# Patient Record
Sex: Male | Born: 1950 | Race: White | Hispanic: No | Marital: Married | Smoking: Never smoker
Health system: Southern US, Community
[De-identification: ages and names within clinical notes are randomized; demographics above are authoritative.]

## PROBLEM LIST (undated history)

## (undated) DIAGNOSIS — I10 Essential (primary) hypertension: Secondary | ICD-10-CM

## (undated) DIAGNOSIS — M545 Low back pain, unspecified: Secondary | ICD-10-CM

## (undated) DIAGNOSIS — I341 Nonrheumatic mitral (valve) prolapse: Secondary | ICD-10-CM

## (undated) DIAGNOSIS — I82409 Acute embolism and thrombosis of unspecified deep veins of unspecified lower extremity: Secondary | ICD-10-CM

## (undated) DIAGNOSIS — C801 Malignant (primary) neoplasm, unspecified: Secondary | ICD-10-CM

## (undated) DIAGNOSIS — M1990 Unspecified osteoarthritis, unspecified site: Secondary | ICD-10-CM

## (undated) DIAGNOSIS — G8929 Other chronic pain: Secondary | ICD-10-CM

## (undated) DIAGNOSIS — Z9229 Personal history of other drug therapy: Secondary | ICD-10-CM

## (undated) DIAGNOSIS — E7800 Pure hypercholesterolemia, unspecified: Secondary | ICD-10-CM

## (undated) DIAGNOSIS — H9319 Tinnitus, unspecified ear: Secondary | ICD-10-CM

## (undated) DIAGNOSIS — I2699 Other pulmonary embolism without acute cor pulmonale: Secondary | ICD-10-CM

## (undated) DIAGNOSIS — K219 Gastro-esophageal reflux disease without esophagitis: Secondary | ICD-10-CM

## (undated) DIAGNOSIS — I493 Ventricular premature depolarization: Secondary | ICD-10-CM

## (undated) HISTORY — DX: Personal history of other drug therapy: Z92.29

## (undated) HISTORY — DX: Low back pain: M54.5

## (undated) HISTORY — DX: Pure hypercholesterolemia, unspecified: E78.00

## (undated) HISTORY — DX: Other chronic pain: G89.29

## (undated) HISTORY — DX: Low back pain, unspecified: M54.50

## (undated) HISTORY — DX: Gastro-esophageal reflux disease without esophagitis: K21.9

## (undated) HISTORY — DX: Nonrheumatic mitral (valve) prolapse: I34.1

## (undated) HISTORY — DX: Other pulmonary embolism without acute cor pulmonale: I26.99

## (undated) HISTORY — DX: Tinnitus, unspecified ear: H93.19

## (undated) HISTORY — PX: TONSILLECTOMY: SUR1361

## (undated) HISTORY — DX: Acute embolism and thrombosis of unspecified deep veins of unspecified lower extremity: I82.409

## (undated) HISTORY — DX: Ventricular premature depolarization: I49.3

---

## 1977-06-08 DIAGNOSIS — J189 Pneumonia, unspecified organism: Secondary | ICD-10-CM

## 1977-06-08 HISTORY — DX: Pneumonia, unspecified organism: J18.9

## 2003-06-09 DIAGNOSIS — I82409 Acute embolism and thrombosis of unspecified deep veins of unspecified lower extremity: Secondary | ICD-10-CM

## 2003-06-09 HISTORY — DX: Acute embolism and thrombosis of unspecified deep veins of unspecified lower extremity: I82.409

## 2004-06-08 HISTORY — PX: LUMBAR SPINE SURGERY: SHX701

## 2005-06-08 HISTORY — PX: COLONOSCOPY: SHX174

## 2015-06-09 HISTORY — PX: TRANSTHORACIC ECHOCARDIOGRAM: SHX275

## 2016-03-31 ENCOUNTER — Ambulatory Visit (INDEPENDENT_AMBULATORY_CARE_PROVIDER_SITE_OTHER): Payer: 59 | Admitting: Cardiology

## 2016-03-31 ENCOUNTER — Encounter: Payer: Self-pay | Admitting: Cardiology

## 2016-03-31 VITALS — BP 138/88 | HR 100 | Ht 70.0 in | Wt 208.5 lb

## 2016-03-31 DIAGNOSIS — I341 Nonrheumatic mitral (valve) prolapse: Secondary | ICD-10-CM | POA: Diagnosis not present

## 2016-03-31 DIAGNOSIS — R002 Palpitations: Secondary | ICD-10-CM | POA: Diagnosis not present

## 2016-03-31 NOTE — Patient Instructions (Addendum)
Testing/Procedures: Your physician has requested that you have an echocardiogram. Echocardiography is a painless test that uses sound waves to create images of your heart. It provides your doctor with information about the size and shape of your heart and how well your heart's chambers and valves are working. This procedure takes approximately one hour. There are no restrictions for this procedure.    Follow-Up: Your physician wants you to follow-up in: 1 year with Dr. Ingal. You will receive a reminder letter in the mail two months in advance. If you don't receive a letter, please call our office to schedule the follow-up appointment.  It was a pleasure seeing you today here in the office. Please do not hesitate to give us a call back if you have any further questions. [phone]  Jenie Parish A. RN, BSN      Echocardiogram An echocardiogram, or echocardiography, uses sound waves (ultrasound) to produce an image of your heart. The echocardiogram is simple, painless, obtained within a short period of time, and offers valuable information to your health care provider. The images from an echocardiogram can provide information such as:  Evidence of coronary artery disease (CAD).  Heart size.  Heart muscle function.  Heart valve function.  Aneurysm detection.  Evidence of a past heart attack.  Fluid buildup around the heart.  Heart muscle thickening.  Assess heart valve function. LET YOUR HEALTH CARE PROVIDER KNOW ABOUT:  Any allergies you have.  All medicines you are taking, including vitamins, herbs, eye drops, creams, and over-the-counter medicines.  Previous problems you or members of your family have had with the use of anesthetics.  Any blood disorders you have.  Previous surgeries you have had.  Medical conditions you have.  Possibility of pregnancy, if this applies. BEFORE THE PROCEDURE  No special preparation is needed. Eat and drink normally.  PROCEDURE   In  order to produce an image of your heart, gel will be applied to your chest and a wand-like tool (transducer) will be moved over your chest. The gel will help transmit the sound waves from the transducer. The sound waves will harmlessly bounce off your heart to allow the heart images to be captured in real-time motion. These images will then be recorded.  You may need an IV to receive a medicine that improves the quality of the pictures. AFTER THE PROCEDURE You may return to your normal schedule including diet, activities, and medicines, unless your health care provider tells you otherwise.   This information is not intended to replace advice given to you by your health care provider. Make sure you discuss any questions you have with your health care provider.   Document Released: 05/22/2000 Document Revised: 06/15/2014 Document Reviewed: 01/30/2013 Elsevier Interactive Patient Education 2016 Elsevier Inc.  

## 2016-03-31 NOTE — Progress Notes (Signed)
Cardiology Office Note   Date:  03/31/2016   ID:  Albert [name], Albert [name] March 01, 1951, MRN [id number]  Referring Doctor:  No primary care provider on file.   Cardiologist:   Wende Bushy, MD   Reason for consultation:  Chief Complaint  Patient presents with  . other    Est. Care no complaints. Meds reviewed verbally with pt.  History of mitral valve prolapse    History of Present Illness: Albert [name] is a 65 y.o. male who presents for establishing cardiology care for history of mitral valve prolapse. He was told in the past to see a cardiologist at least every 5 years for an echocardiogram. On his last echocardiogram in [place], he was told that his mitral valve actually did not look too bad and the prolapse was really borderline.  Patient denies symptoms of chest pain and shortness of breath. He recently started working out again and walks/jogs on the treadmill 30 minutes, 4 times a week. This pedis 2-3 mph. At the end of the exercise, he is completely asymptomatic.  He has occasional palpitations which were known from previous monitors to be PVCs. He is aware of certain triggers: Stress or anxiety, bloatedness or possibly reflux or heartburn.  Patient denies PND, orthopnea, edema. No abdominal pain, loss of consciousness.   ROS:  Please see the history of present illness. Aside from mentioned under HPI, all other systems are reviewed and negative.     Past Medical History:  Diagnosis Date  . Acute deep vein thrombosis of lower limb (HCC)   . Chronic lower back pain   . GERD (gastroesophageal reflux disease)   . History of anticoagulant therapy   . Hypercholesterolemia   . Mitral valve prolapse   . Pulmonary embolism ([place])   . Tinnitus     Past Surgical History:  Procedure Laterality Date  . COLONOSCOPY  2007  . LUMBAR SPINE SURGERY  2006  . TONSILLECTOMY       reports that he has never smoked. He has never used smokeless tobacco. He reports that he does not  drink alcohol or use drugs.   family history includes Lung cancer in his father and mother.   No outpatient prescriptions prior to visit.   No facility-administered medications prior to visit.      Allergies: Aspirin; Nsaids; and Penicillins    PHYSICAL EXAM: VS:  BP 138/88 (BP Location: Right Arm, Patient Position: Sitting, Cuff Size: Normal)   Pulse 100   Ht 5\' 10"  (1.778 m)   Wt 208 lb 8 oz (94.6 kg)   BMI 29.92 kg/m  , Body mass index is 29.92 kg/m. Wt Readings from Last 3 Encounters:  03/31/16 208 lb 8 oz (94.6 kg)    GENERAL:  well developed, well nourished, Overweight, not in acute distress HEENT: normocephalic, pink conjunctivae, anicteric sclerae, no xanthelasma, normal dentition, oropharynx clear NECK:  no neck vein engorgement, JVP normal, no hepatojugular reflux, carotid upstroke brisk and symmetric, no bruit, no thyromegaly, no lymphadenopathy LUNGS:  good respiratory effort, clear to auscultation bilaterally CV:  PMI not displaced, no thrills, no lifts, S1 and S2 within normal limits, no palpable S3 or S4, no murmurs, no rubs, no gallops ABD:  Soft, nontender, nondistended, normoactive bowel sounds, no abdominal aortic bruit, no hepatomegaly, no splenomegaly MS: nontender back, no kyphosis, no scoliosis, no joint deformities EXT:  2+ DP/PT pulses, no edema, no varicosities, no cyanosis, no clubbing SKIN: warm, nondiaphoretic, normal turgor, no ulcers NEUROPSYCH: alert,  oriented to person, place, and time, sensory/motor grossly intact, normal mood, appropriate affect  Recent Labs: No results found for requested labs within last 8760 hours.   Lipid Panel No results found for: CHOL, TRIG, HDL, CHOLHDL, VLDL, LDLCALC, LDLDIRECT   Other studies Reviewed:  EKG:  The ekg from 03/31/2016 was personally reviewed by me and it revealed sinus rhythm, 100 BPM.  Additional studies/ records that were reviewed personally reviewed by me today include: None available  Lipid  panel 02/22/2016: TC 216 Triglyceride 180 HDL 49 LDL 131  ASSESSMENT AND PLAN:  History of mitral valve prolapse  No appreciable murmur. recommend echocardiogram for surveillance  Palpitations Per patient, he was told that he had PVCs. That was the reason he was started on metoprolol. So far, he has not needed a higher dose. He can deal with the triggers such as stress and bloatedness. We talked about why stress can lead to more PVCs. We also talked about how bloatedness or heartburn or reflux or any GI issue may possibly lead to ectopy or PVCs.  He should inform office if he has worsening of his palpitations. At that time, we can consider another monitor. Patient verbalized understanding.  Hyperlipidemia Patient has elected to do lifestyle changes including heart healthy diet and increased physical activity. We talked about heart healthy diet.  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  The patient does not have concerns regarding medicines.  Labs/ tests ordered today include:  Orders Placed This Encounter  Procedures  . EKG 12-Lead    I had a lengthy and detailed discussion with the patient regarding diagnoses, prognosis, diagnostic options, treatment options , and side effects of medications.   I counseled the patient on importance of lifestyle modification including heart healthy diet, regular physical activity .   Disposition:   FU with undersigned In one year  I spent at least 40 minutes with the patient today and more than 50% of the time was spent counseling the patient and coordinating care.    Signed, Wende Bushy, MD  03/31/2016 11:32 AM    [place]  This note was generated in part with voice recognition software and I apologize for any typographical errors that were not detected and corrected.

## 2016-05-04 ENCOUNTER — Ambulatory Visit (INDEPENDENT_AMBULATORY_CARE_PROVIDER_SITE_OTHER): Payer: 59

## 2016-05-04 ENCOUNTER — Other Ambulatory Visit: Payer: Self-pay

## 2016-05-04 DIAGNOSIS — I341 Nonrheumatic mitral (valve) prolapse: Secondary | ICD-10-CM | POA: Diagnosis not present

## 2018-10-04 ENCOUNTER — Telehealth: Payer: Self-pay | Admitting: Cardiology

## 2018-10-04 NOTE — Telephone Encounter (Signed)
Spoke with patient to schedule follow up Patient has moved to [place], [place] Removed recall

## 2019-06-19 DIAGNOSIS — I493 Ventricular premature depolarization: Secondary | ICD-10-CM | POA: Diagnosis not present

## 2019-06-19 DIAGNOSIS — G4700 Insomnia, unspecified: Secondary | ICD-10-CM | POA: Diagnosis not present

## 2019-06-19 DIAGNOSIS — I341 Nonrheumatic mitral (valve) prolapse: Secondary | ICD-10-CM | POA: Diagnosis not present

## 2019-06-19 DIAGNOSIS — Z86711 Personal history of pulmonary embolism: Secondary | ICD-10-CM | POA: Diagnosis not present

## 2019-06-22 ENCOUNTER — Telehealth: Payer: Self-pay | Admitting: Hematology and Oncology

## 2019-06-22 NOTE — Telephone Encounter (Signed)
Received a new hem referral from Dr. Moreen Fowler for discuss option of switching from coumadin to [place]. Albert [name] has been cld and scheduled to see Dr. Lindi Adie on 1/20 at 345pm. Pt aware to arrive 15 minutes early.

## 2019-06-27 NOTE — Progress Notes (Signed)
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 male is here because of a history of DVT and PE. He is referred by his PCP, Dr. Antony Contras. After lower back surgery in 2005 he developed a left knee DVT and PE. Work-up was negative at that time. In 2007, he developed a right DVT and was started on warfarin 5mg  daily. He developed a third DVT in 2016 after stopping warfarin and was reinitiated on lifelong anticoagulation. He presents to the clinic today for initial evaluation and discussion of switching from warfarin to DOAC.   I reviewed his records extensively and collaborated the history with the patient.  MEDICAL HISTORY:  Past Medical History:  Diagnosis Date  . Acute deep vein thrombosis of lower limb (HCC)   . Chronic lower back pain   . GERD (gastroesophageal reflux disease)   . History of anticoagulant therapy   . Hypercholesterolemia   . Mitral valve prolapse   . Pulmonary embolism ([facility])   . Tinnitus     SURGICAL HISTORY: Past Surgical History:  Procedure Laterality Date  . COLONOSCOPY  2007  . LUMBAR SPINE SURGERY  2006  . TONSILLECTOMY      SOCIAL HISTORY: Social History   Socioeconomic History  . Marital status: Married    Spouse name: Not on file  . Number of children: Not on file  . Years of education: Not on file  . Highest education level: Not on file  Occupational History  . Not on file  Tobacco Use  . Smoking status: Never Smoker  . Smokeless tobacco: Never Used  Substance and Sexual Activity  . Alcohol use: No  . Drug use: No  . Sexual activity: Not on file  Other Topics Concern  . Not on file  Social History Narrative  . Not on file   Social Determinants of Health   Financial Resource Strain:   . Difficulty of Paying  Living Expenses: Not on file  Food Insecurity:   . Worried About Charity fundraiser in the Last Year: Not on file  . Ran Out of Food in the Last Year: Not on file  Transportation Needs:   . Lack of Transportation (Medical): Not on file  . Lack of Transportation (Non-Medical): Not on file  Physical Activity:   . Days of Exercise per Week: Not on file  . Minutes of Exercise per Session: Not on file  Stress:   . Feeling of Stress : Not on file  Social Connections:   . Frequency of Communication with Friends and Family: Not on file  . Frequency of Social Gatherings with Friends and Family: Not on file  . Attends Religious Services: Not on file  . Active Member of Clubs or Organizations: Not on file  . Attends Archivist Meetings: Not on file  . Marital Status: Not on file  Intimate Partner Violence:   . Fear of Current or Ex-Partner: Not on file  . Emotionally Abused: Not on file  . Physically Abused: Not on file  . Sexually Abused: Not on file    FAMILY HISTORY: Family History  Problem Relation Age of Onset  . Lung cancer Mother   . Lung cancer Father     ALLERGIES:  is allergic to aspirin; nsaids; and penicillins.  MEDICATIONS:  Current Outpatient Medications  Medication Sig Dispense Refill  . metoprolol succinate (TOPROL-XL) 25 MG 24 hr tablet Takes 1 tablet daily.  1  . traMADol (ULTRAM) 50 MG tablet TK 1 T PO BID PRN  1  . warfarin (COUMADIN) 5 MG tablet TK 1 T PO TWICE A WEEK AND 1 AND 1/2 TS THE REST OF THE WEEK  3   No current facility-administered medications for this visit.    REVIEW OF SYSTEMS:   Constitutional: Denies fevers, chills or abnormal night sweats Eyes: Denies blurriness of vision, double vision or watery eyes Ears, nose, mouth, throat, and face: Denies mucositis or sore throat Respiratory: Denies cough, dyspnea or wheezes Cardiovascular: Denies palpitation, chest discomfort or lower extremity swelling Gastrointestinal:  Denies nausea,  heartburn or change in bowel habits Skin: Denies abnormal skin rashes Lymphatics: Denies new lymphadenopathy or easy bruising Neurological:Denies numbness, tingling or new weaknesses Behavioral/Psych: Mood is stable, no new changes  All other systems were reviewed with the patient and are negative.  PHYSICAL EXAMINATION: ECOG PERFORMANCE STATUS: 1 - Symptomatic but completely ambulatory  There were no vitals filed for this visit. There were no vitals filed for this visit.  GENERAL:alert, no distress and comfortable SKIN: skin color, texture, turgor are normal, no rashes or significant lesions EYES: normal, conjunctiva are pink and non-injected, sclera clear OROPHARYNX:no exudate, no erythema and lips, buccal mucosa, and tongue normal  NECK: supple, thyroid normal size, non-tender, without nodularity LYMPH:  no palpable lymphadenopathy in the cervical, axillary or inguinal LUNGS: clear to auscultation and percussion with normal breathing effort HEART: regular rate & rhythm and no murmurs and no lower extremity edema ABDOMEN:abdomen soft, non-tender and normal bowel sounds Musculoskeletal:no cyanosis of digits and no clubbing  PSYCH: alert & oriented x 3 with fluent speech NEURO: no focal motor/sensory deficits  ASSESSMENT AND PLAN:  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 He does not smoke cigarettes and stays very active and he is not overweight.  There is no family history of blood clots either. Patient has retired from [employer].  Patient moved from [place] to [place] 05/22/2019.  This is to be closer to their grandchildren. Current treatment: Coumadin   Regarding the question about switching from Coumadin to DOAC's: I would like to check for lupus anticoagulant testing.  If it is negative then the patient can be switched to Xarelto. If it is positive then he will remain on Coumadin for life. I will call him with the results of this test.  We will see him in 1 year for follow-up if we end up switching him to Xarelto  All questions were answered. The patient knows to call the clinic with any problems, questions or concerns.   Rulon Eisenmenger, MD, MPH 06/28/2019    I, Molly Dorshimer, am acting as scribe for Nicholas Lose, MD.  I have reviewed the above documentation for accuracy and completeness, and I agree with the above.

## 2019-06-28 ENCOUNTER — Inpatient Hospital Stay: Payer: Medicare HMO | Attending: Hematology and Oncology | Admitting: Hematology and Oncology

## 2019-06-28 ENCOUNTER — Other Ambulatory Visit: Payer: Self-pay

## 2019-06-28 DIAGNOSIS — Z86718 Personal history of other venous thrombosis and embolism: Secondary | ICD-10-CM

## 2019-06-28 DIAGNOSIS — Z86711 Personal history of pulmonary embolism: Secondary | ICD-10-CM

## 2019-06-28 DIAGNOSIS — I82409 Acute embolism and thrombosis of unspecified deep veins of unspecified lower extremity: Secondary | ICD-10-CM | POA: Insufficient documentation

## 2019-06-28 DIAGNOSIS — Z7901 Long term (current) use of anticoagulants: Secondary | ICD-10-CM | POA: Diagnosis not present

## 2019-06-28 DIAGNOSIS — E7800 Pure hypercholesterolemia, unspecified: Secondary | ICD-10-CM

## 2019-06-28 DIAGNOSIS — I82403 Acute embolism and thrombosis of unspecified deep veins of lower extremity, bilateral: Secondary | ICD-10-CM

## 2019-06-28 DIAGNOSIS — Z79899 Other long term (current) drug therapy: Secondary | ICD-10-CM

## 2019-06-28 MED ORDER — LORAZEPAM 2 MG PO TABS: 1.0000 mg | ORAL_TABLET | Freq: Every day | ORAL | Status: DC

## 2019-06-28 NOTE — Assessment & Plan Note (Signed)
Recurrent DVT and PE:  Patient moved from [place] to [place] 05/22/2019.  This is to be closer to their grandchildren. Current treatment: Coumadin x5 years Regarding the question about switching from Coumadin to DOAC's: I would like to check for lupus anticoagulant testing.  If it is negative then the patient can be switched to Xarelto. If it is positive then he will remain on Coumadin for life.

## 2019-06-29 ENCOUNTER — Other Ambulatory Visit: Payer: Self-pay

## 2019-06-29 ENCOUNTER — Inpatient Hospital Stay: Payer: Medicare HMO

## 2019-06-29 DIAGNOSIS — Z7901 Long term (current) use of anticoagulants: Secondary | ICD-10-CM | POA: Diagnosis not present

## 2019-06-29 DIAGNOSIS — Z86711 Personal history of pulmonary embolism: Secondary | ICD-10-CM | POA: Diagnosis not present

## 2019-06-29 DIAGNOSIS — E7800 Pure hypercholesterolemia, unspecified: Secondary | ICD-10-CM | POA: Diagnosis not present

## 2019-06-29 DIAGNOSIS — Z79899 Other long term (current) drug therapy: Secondary | ICD-10-CM | POA: Diagnosis not present

## 2019-06-29 DIAGNOSIS — I82403 Acute embolism and thrombosis of unspecified deep veins of lower extremity, bilateral: Secondary | ICD-10-CM

## 2019-06-29 DIAGNOSIS — Z86718 Personal history of other venous thrombosis and embolism: Secondary | ICD-10-CM | POA: Diagnosis not present

## 2019-06-30 ENCOUNTER — Ambulatory Visit: Payer: Medicare HMO | Admitting: Cardiology

## 2019-06-30 ENCOUNTER — Encounter: Payer: Self-pay | Admitting: Cardiology

## 2019-06-30 VITALS — BP 150/87 | HR 99 | Ht 70.0 in | Wt 195.0 lb

## 2019-06-30 DIAGNOSIS — I341 Nonrheumatic mitral (valve) prolapse: Secondary | ICD-10-CM | POA: Diagnosis not present

## 2019-06-30 DIAGNOSIS — I82409 Acute embolism and thrombosis of unspecified deep veins of unspecified lower extremity: Secondary | ICD-10-CM

## 2019-06-30 DIAGNOSIS — Z8679 Personal history of other diseases of the circulatory system: Secondary | ICD-10-CM | POA: Diagnosis not present

## 2019-06-30 DIAGNOSIS — I493 Ventricular premature depolarization: Secondary | ICD-10-CM | POA: Diagnosis not present

## 2019-06-30 DIAGNOSIS — I340 Nonrheumatic mitral (valve) insufficiency: Secondary | ICD-10-CM | POA: Insufficient documentation

## 2019-06-30 DIAGNOSIS — I10 Essential (primary) hypertension: Secondary | ICD-10-CM | POA: Diagnosis not present

## 2019-06-30 NOTE — Patient Instructions (Signed)
Medication Instructions:  NO CHANGES *If you need a refill on your cardiac medications before your next appointment, please call your pharmacy*  Testing/Procedures: Your physician has requested that you have an echocardiogram. Echocardiography is a painless test that uses sound waves to create images of your heart. It provides your doctor with information about the size and shape of your heart and how well your heart's chambers and valves are working. This procedure takes approximately one hour. There are no restrictions for this procedure. -- this is done at [address] - [address]rd Floor -- this will be scheduled for Jan 2022  Follow-Up: At [facility], you and your health needs are our priority.  As part of our continuing mission to provide you with exceptional heart care, we have created designated Provider Care Teams.  These Care Teams include your primary Cardiologist (physician) and Advanced Practice Providers (APPs -  Physician Assistants and Nurse Practitioners) who all work together to provide you with the care you need, when you need it.  Your next appointment:   12 month(s) - after echo  The format for your next appointment:   In Person  Provider:   Glenetta Hew, MD  Other Instructions

## 2019-06-30 NOTE — Progress Notes (Signed)
Primary Care Provider: Antony Contras, MD Cardiologist: No primary care provider on file.   Clinic Note: Chief Complaint  Patient presents with  . New Patient (Initial Visit)    Establish cardiologist in [place]  . Mitral Valve Prolapse    Has been told Albert has mitral prolapse  . Palpitations    History of PVCs, controlled with Toprol    HPI:    Albert [name]. Albert [name] is a 68 y.o. male with a PMH below who presents today for Establishment of Cardiology Care for management of PVCs and history of MITRAL VALVE PROLAPSE who is being seen today at the request of Antony Contras, MD.  Albert [name] was last seen on March 31, 2016 by Dr. Yvone Neu [facility] office) -> only noted occasional palpitations.  Echo ordered.  Recent Hospitalizations: None  Reviewed  CV studies:    The following studies were reviewed today: (if available, images/films reviewed: From Epic Chart or Care Everywhere) . Echo 06/2015: LVEF 60 to 65%.  No RWMA.  GR 1 DD.  Mild MR (no mention of MVP).  Normal left atrial size.  Otherwise normal valves chamber sizes.   Interval History:   Albert [name]. Albert [name] is a very pleasant gentleman who is recently moved from [place] to [place] (Albert is a native of [name], but his [name] is a native of this region of [place]) to be closer to their children and grandchildren. Albert was told by his cardiologist in [place] that Albert had mild mitral valve prolapse and recommended every 5-year echocardiogram, however the last time Albert had an echocardiogram done was actually in our [place] office in 2017 reviewed above (with no comment on mitral prolapse)  Albert [name] that ever since Albert has been on Toprol, Albert has not had any further episodes of his tachycardia or palpitations.  Albert seems to doing quite well metoprolol and notes his blood pressure is usually much better than it is here today his heart rate is usually in the 60s to 80s.  Albert is very active enjoys long walks and Albert is  [name] like to go tracking in the [place] in [place] region.  Albert has no sense of chronotropic incompetence with any fatigue or like Albert can go.  Albert has had angina or heart failure symptoms. His last episode was about 5 years ago and Albert has not had any complications since.  Albert just saw Hematology-Oncology to discuss potentially converting to Xarelto from warfarin.  Apparently they are awaiting testing for factor V Leiden deficiency for lupus anticoagulant prior to making the conversion because it may make a difference in the treatment.   CV Review of Symptoms (Summary) no chest pain or dyspnea on exertion negative for - edema, orthopnea, paroxysmal nocturnal dyspnea, rapid heart rate, shortness of breath or PVCs are minimally present on Toprol.  The patient does not have symptoms concerning for COVID-19 infection (fever, chills, cough, or new shortness of breath).  The patient is practicing social distancing &Masking.   REVIEWED OF SYSTEMS   A comprehensive ROS was performed. Review of Systems  Constitutional: Negative for malaise/fatigue and weight loss.  HENT: Negative for congestion and nosebleeds.   Respiratory: Negative for cough, shortness of breath and wheezing.   Cardiovascular: Negative for chest pain and leg swelling.  Gastrointestinal: Negative for blood in stool, diarrhea, heartburn and melena.  Musculoskeletal: Negative for joint pain and myalgias.  Neurological: Negative for dizziness, focal weakness and weakness.  Psychiatric/Behavioral: Negative  for memory loss. The patient is not nervous/anxious and does not have insomnia.   All other systems reviewed and are negative.  I have reviewed and (if needed) personally updated the patient's problem list, medications, allergies, past medical and surgical history, social and family history.   PAST MEDICAL HISTORY   Past Medical History:  Diagnosis Date  . Acute deep vein thrombosis of lower limb ([place]) 2005    Initial episode in 2005 with DVT and PE following low back surgery.  [Reportedly negative evaluation from second episode right leg DVT 2007 (1/2 to 1-year); third episode in 2016 now on lifetime anticoagulation.  (Does home INR checks warfarin).  . Chronic lower back pain   . Frequent PVCs    With occasional cardiac, controlled on Toprol 25 mg  . GERD (gastroesophageal reflux disease)   . History of anticoagulant therapy   . Hypercholesterolemia    Not currently on treatment  . Mitral valve prolapse    By historical data.  Not confirmed in 2017.  . Pulmonary embolism ([place])    History of multiple DVTs with PE, on chronic anticoagulation (currently on warfarin with potential conversion to Xarelto)  . Tinnitus     PAST SURGICAL HISTORY   Past Surgical History:  Procedure Laterality Date  . COLONOSCOPY  2007  . LUMBAR SPINE SURGERY  2006  . TONSILLECTOMY    . TRANSTHORACIC ECHOCARDIOGRAM  06/2015   LVEF 60 to 65%.  No RWMA.  GR 1 DD.  Mild MR (no mention of MVP).  Normal left atrial size.  Otherwise normal valves chamber sizes.    MEDICATIONS/ALLERGIES   Current Meds  Medication Sig  . LORazepam (ATIVAN) 2 MG tablet Take 2 mg by mouth every 6 (six) hours as needed for anxiety.  . metoprolol succinate (TOPROL-XL) 25 MG 24 hr tablet Takes 1 tablet daily.  Marland Kitchen warfarin (COUMADIN) 5 MG tablet Take 5 mg by mouth daily. Take 5 mg every day except Tuesday and Thursday and on Tuesday and Thursday take 7.5 mg    Allergies  Allergen Reactions  . Aspirin   . Nsaids   . Other Other (See Comments)    Do not give due to chronic anticoagulation on coumadin.  Marland Kitchen Penicillins     SOCIAL HISTORY/FAMILY HISTORY   Social History   Tobacco Use  . Smoking status: Never Smoker  . Smokeless tobacco: Never Used  Substance Use Topics  . Alcohol use: No  . Drug use: No   Social History   Social History Narrative   Albert has [name] recently moved to [place] from the [place] area in [place].   She is a native of [place] while Albert is from [place].      Their children and grandchildren are here in [place].      They both enjoy walking and hiking in [place].    Family History family history includes Lung cancer in his father and mother.   OBJCTIVE -PE, EKG, labs   Wt Readings from Last 3 Encounters:  06/30/19 195 lb (88.5 kg)  06/28/19 191 lb 9.6 oz (86.9 kg)  03/31/16 208 lb 8 oz (94.6 kg)    Physical Exam: BP (!) 150/87   Pulse 99   Ht 5\' 10"  (1.778 m)   Wt 195 lb (88.5 kg)   SpO2 97%   BMI 27.98 kg/m  Physical Exam  Constitutional: Albert is oriented to person, place, and time. Albert appears well-developed and well-nourished.  Well-groomed.  Healthy-appearing gentleman in no acute distress.  HENT:  Head: Normocephalic and atraumatic.  Eyes: Pupils are equal, round, and reactive to light. Conjunctivae and EOM are normal. No scleral icterus.  Neck: No hepatojugular reflux and no JVD present. Carotid bruit is not present. No thyromegaly present.  Cardiovascular: Normal rate, regular rhythm, normal heart sounds and intact distal pulses.  No extrasystoles are present. PMI is not displaced. Exam reveals no gallop and no friction rub.  No murmur heard. Borderline tachycardia; unable to hear any midsystolic click and no significant mitral murmur heard.  Pulmonary/Chest: Effort normal and breath sounds normal. No respiratory distress. Albert has no wheezes. Albert has no rales.  Abdominal: Soft. Bowel sounds are normal. Albert exhibits distension. There is no abdominal tenderness. There is no rebound.  Musculoskeletal:        General: No edema. Normal range of motion.     Cervical back: Normal range of motion and neck supple.  Neurological: Albert is alert and oriented to person, place, and time. No cranial nerve deficit.  Psychiatric: Albert has a normal mood and affect. His behavior is normal. Judgment and thought content normal.  Vitals reviewed.     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  I understand potential reservations and that Albert is doing very well with warfarin, I do think that DOAC is much better option.  They are waiting on lab results, but I think Albert agrees that converting to Xarelto probably best option.  Albert likes the freedom of not having any routine testing.  The cost would not be prohibitive.      Relevant Medications   warfarin (COUMADIN) 5 MG tablet   H/O mitral valve disease (Chronic)    Reported history of mitral prolapse.  I think we can recheck an Echocardiogram in 2022 which be 5 years from the last study.  If that also does not suggest mitral prolapse I do not think we need to follow further.      Relevant Orders   EKG 12-Lead   ECHOCARDIOGRAM COMPLETE   Essential hypertension (Chronic)    Blood pressure is little high today, but usually much better controlled than this.  Albert is on stable dose of Toprol.  Will defer any further treatment until we see him back in follow-up.  His pressure was relatively controlled during his last visit in 2017 and then with his heme-onc visit recently.  Albert does seem somewhat excited to be in a cardiology office and admits to being somewhat anxious.      Relevant Medications   warfarin (COUMADIN) 5 MG tablet   Other Relevant Orders   EKG 12-Lead   ECHOCARDIOGRAM COMPLETE   Frequent PVCs (Chronic)    Well-controlled, currently asymptomatic on current dose of Toprol.  Tolerating it well without any  chronotropic incompetence symptoms      Relevant Medications   warfarin (COUMADIN) 5 MG tablet   Other Relevant Orders   EKG 12-Lead   ECHOCARDIOGRAM COMPLETE  Other Visit Diagnoses    MVP (mitral valve prolapse)    -  Primary   Relevant Medications   warfarin (COUMADIN) 5 MG tablet       COVID-19 Education: The signs and symptoms of COVID-19 were discussed with the patient and how to seek care for testing (follow up with PCP or arrange E-visit).   The importance of social distancing was discussed today.  I spent a total of 67minutes with the patient. >  50% of the time was spent in direct patient consultation.  Additional time spent with chart review (studies, outside notes, etc): 15 Total Time: 40 min   Current medicines are reviewed at length with the patient today.  (+/- concerns) --one cardiology opinion on DOAC versus warfarin   Patient Instructions / Medication Changes & Studies & Tests Ordered   Patient Instructions  Medication Instructions:  NO CHANGES *If you need a refill on your cardiac medications before your next appointment, please call your pharmacy*  Testing/Procedures: Your physician has requested that you have an echocardiogram. Echocardiography is a painless test that uses sound waves to create images of your heart. It provides your doctor with information about the size and shape of your heart and how well your heart's chambers and valves are working. This procedure takes approximately one hour. There are no restrictions for this procedure. -- this is done at [address] - [address]rd Floor -- this will be scheduled for Jan 2022  Follow-Up: At [facility], you and your health needs are our priority.  As part of our continuing mission to provide you with exceptional heart care, we have created designated Provider Care Teams.  These Care Teams include your primary Cardiologist (physician) and Advanced Practice Providers (APPs -  Physician Assistants  and Nurse Practitioners) who all work together to provide you with the care you need, when you need it.  Your next appointment:   12 month(s) - after echo  The format for your next appointment:   In Person  Provider:   Glenetta Hew, MD  Other Instructions      Studies Ordered:   Orders Placed This Encounter  Procedures  . EKG 12-Lead  . ECHOCARDIOGRAM COMPLETE     Glenetta Hew, M.D., M.S. Interventional Cardiologist   Pager # [phone] Phone # [phone] [address] [place], [place] [address]   Thank you for choosing Heartcare at [facility]!!

## 2019-07-01 ENCOUNTER — Encounter: Payer: Self-pay | Admitting: Cardiology

## 2019-07-01 ENCOUNTER — Encounter: Payer: Self-pay | Admitting: Hematology and Oncology

## 2019-07-01 LAB — LUPUS ANTICOAGULANT PANEL
DRVVT: 55.2 s — ABNORMAL HIGH (ref 0.0–47.0)
PTT Lupus Anticoagulant: 36.8 s (ref 0.0–51.9)

## 2019-07-01 LAB — DRVVT MIX: dRVVT Mix: 38.5 s (ref 0.0–40.4)

## 2019-07-01 NOTE — Assessment & Plan Note (Signed)
He wanted to get a cardiology opinion on the concept of switching from warfarin to [place].  I understand potential reservations and that he is doing very well with warfarin, I do think that DOAC is much better option.  They are waiting on lab results, but I think he agrees that converting to Xarelto probably best option.  He likes the freedom of not having any routine testing.  The cost would not be prohibitive.

## 2019-07-01 NOTE — Assessment & Plan Note (Signed)
Blood pressure is little high today, but usually much better controlled than this.  He is on stable dose of Toprol.  Will defer any further treatment until we see him back in follow-up.  His pressure was relatively controlled during his last visit in 2017 and then with his heme-onc visit recently.  He does seem somewhat excited to be in a cardiology office and admits to being somewhat anxious.

## 2019-07-01 NOTE — Assessment & Plan Note (Addendum)
Well-controlled, currently asymptomatic on current dose of Toprol.  Tolerating it well without any chronotropic incompetence symptoms

## 2019-07-01 NOTE — Assessment & Plan Note (Signed)
Reported history of mitral prolapse.  I think we can recheck an Echocardiogram in 2022 which be 5 years from the last study.  If that also does not suggest mitral prolapse I do not think we need to follow further.

## 2019-07-03 ENCOUNTER — Other Ambulatory Visit: Payer: Self-pay | Admitting: Hematology and Oncology

## 2019-07-03 MED ORDER — RIVAROXABAN 20 MG PO TABS: 20.0000 mg | ORAL_TABLET | Freq: Every day | ORAL | 3 refills | Status: DC

## 2019-07-03 NOTE — Progress Notes (Signed)
Lupus anticoagulant Negative Xarelto has been sent. Coumadin discontinued Patient is very happy with this.

## 2019-07-06 ENCOUNTER — Encounter: Payer: Self-pay | Admitting: Hematology and Oncology

## 2019-07-16 ENCOUNTER — Encounter: Payer: Self-pay | Admitting: Hematology and Oncology

## 2019-07-18 ENCOUNTER — Encounter: Payer: Self-pay | Admitting: Hematology and Oncology

## 2019-10-06 DIAGNOSIS — Z86711 Personal history of pulmonary embolism: Secondary | ICD-10-CM | POA: Diagnosis not present

## 2019-10-06 DIAGNOSIS — M5432 Sciatica, left side: Secondary | ICD-10-CM | POA: Diagnosis not present

## 2019-10-11 DIAGNOSIS — M25552 Pain in left hip: Secondary | ICD-10-CM | POA: Diagnosis not present

## 2019-10-11 DIAGNOSIS — M9905 Segmental and somatic dysfunction of pelvic region: Secondary | ICD-10-CM | POA: Diagnosis not present

## 2019-10-11 DIAGNOSIS — M9903 Segmental and somatic dysfunction of lumbar region: Secondary | ICD-10-CM | POA: Diagnosis not present

## 2019-10-11 DIAGNOSIS — M5116 Intervertebral disc disorders with radiculopathy, lumbar region: Secondary | ICD-10-CM | POA: Diagnosis not present

## 2019-10-12 DIAGNOSIS — M9905 Segmental and somatic dysfunction of pelvic region: Secondary | ICD-10-CM | POA: Diagnosis not present

## 2019-10-12 DIAGNOSIS — M5116 Intervertebral disc disorders with radiculopathy, lumbar region: Secondary | ICD-10-CM | POA: Diagnosis not present

## 2019-10-12 DIAGNOSIS — M9903 Segmental and somatic dysfunction of lumbar region: Secondary | ICD-10-CM | POA: Diagnosis not present

## 2019-10-12 DIAGNOSIS — M25552 Pain in left hip: Secondary | ICD-10-CM | POA: Diagnosis not present

## 2019-10-24 DIAGNOSIS — M4726 Other spondylosis with radiculopathy, lumbar region: Secondary | ICD-10-CM | POA: Diagnosis not present

## 2019-10-24 DIAGNOSIS — M4326 Fusion of spine, lumbar region: Secondary | ICD-10-CM | POA: Diagnosis not present

## 2019-10-24 DIAGNOSIS — M5432 Sciatica, left side: Secondary | ICD-10-CM | POA: Diagnosis not present

## 2019-11-01 DIAGNOSIS — M47816 Spondylosis without myelopathy or radiculopathy, lumbar region: Secondary | ICD-10-CM | POA: Diagnosis not present

## 2019-11-08 DIAGNOSIS — Z789 Other specified health status: Secondary | ICD-10-CM | POA: Diagnosis not present

## 2019-11-08 DIAGNOSIS — Z7409 Other reduced mobility: Secondary | ICD-10-CM | POA: Diagnosis not present

## 2019-11-08 DIAGNOSIS — G8929 Other chronic pain: Secondary | ICD-10-CM | POA: Diagnosis not present

## 2019-11-08 DIAGNOSIS — M5442 Lumbago with sciatica, left side: Secondary | ICD-10-CM | POA: Diagnosis not present

## 2019-11-20 DIAGNOSIS — G8929 Other chronic pain: Secondary | ICD-10-CM | POA: Diagnosis not present

## 2019-11-20 DIAGNOSIS — Z789 Other specified health status: Secondary | ICD-10-CM | POA: Diagnosis not present

## 2019-11-20 DIAGNOSIS — Z7409 Other reduced mobility: Secondary | ICD-10-CM | POA: Diagnosis not present

## 2019-11-20 DIAGNOSIS — M5442 Lumbago with sciatica, left side: Secondary | ICD-10-CM | POA: Diagnosis not present

## 2019-12-01 DIAGNOSIS — Z7409 Other reduced mobility: Secondary | ICD-10-CM | POA: Diagnosis not present

## 2019-12-01 DIAGNOSIS — M5442 Lumbago with sciatica, left side: Secondary | ICD-10-CM | POA: Diagnosis not present

## 2019-12-01 DIAGNOSIS — G8929 Other chronic pain: Secondary | ICD-10-CM | POA: Diagnosis not present

## 2019-12-01 DIAGNOSIS — Z789 Other specified health status: Secondary | ICD-10-CM | POA: Diagnosis not present

## 2019-12-05 DIAGNOSIS — M5442 Lumbago with sciatica, left side: Secondary | ICD-10-CM | POA: Diagnosis not present

## 2019-12-05 DIAGNOSIS — M4726 Other spondylosis with radiculopathy, lumbar region: Secondary | ICD-10-CM | POA: Diagnosis not present

## 2019-12-05 DIAGNOSIS — M5432 Sciatica, left side: Secondary | ICD-10-CM | POA: Diagnosis not present

## 2019-12-05 DIAGNOSIS — M4326 Fusion of spine, lumbar region: Secondary | ICD-10-CM | POA: Diagnosis not present

## 2019-12-05 DIAGNOSIS — Z7409 Other reduced mobility: Secondary | ICD-10-CM | POA: Diagnosis not present

## 2019-12-05 DIAGNOSIS — G8929 Other chronic pain: Secondary | ICD-10-CM | POA: Diagnosis not present

## 2019-12-05 DIAGNOSIS — Z789 Other specified health status: Secondary | ICD-10-CM | POA: Diagnosis not present

## 2019-12-13 DIAGNOSIS — Z789 Other specified health status: Secondary | ICD-10-CM | POA: Diagnosis not present

## 2019-12-13 DIAGNOSIS — M5442 Lumbago with sciatica, left side: Secondary | ICD-10-CM | POA: Diagnosis not present

## 2019-12-13 DIAGNOSIS — G8929 Other chronic pain: Secondary | ICD-10-CM | POA: Diagnosis not present

## 2019-12-13 DIAGNOSIS — Z7409 Other reduced mobility: Secondary | ICD-10-CM | POA: Diagnosis not present

## 2019-12-18 DIAGNOSIS — M7918 Myalgia, other site: Secondary | ICD-10-CM | POA: Diagnosis not present

## 2019-12-18 DIAGNOSIS — M542 Cervicalgia: Secondary | ICD-10-CM | POA: Diagnosis not present

## 2019-12-18 DIAGNOSIS — M47892 Other spondylosis, cervical region: Secondary | ICD-10-CM | POA: Diagnosis not present

## 2019-12-21 DIAGNOSIS — Z789 Other specified health status: Secondary | ICD-10-CM | POA: Diagnosis not present

## 2019-12-21 DIAGNOSIS — G8929 Other chronic pain: Secondary | ICD-10-CM | POA: Diagnosis not present

## 2019-12-21 DIAGNOSIS — M5442 Lumbago with sciatica, left side: Secondary | ICD-10-CM | POA: Diagnosis not present

## 2019-12-21 DIAGNOSIS — Z7409 Other reduced mobility: Secondary | ICD-10-CM | POA: Diagnosis not present

## 2020-01-01 DIAGNOSIS — Z125 Encounter for screening for malignant neoplasm of prostate: Secondary | ICD-10-CM | POA: Diagnosis not present

## 2020-01-01 DIAGNOSIS — I341 Nonrheumatic mitral (valve) prolapse: Secondary | ICD-10-CM | POA: Diagnosis not present

## 2020-01-01 DIAGNOSIS — D6869 Other thrombophilia: Secondary | ICD-10-CM | POA: Diagnosis not present

## 2020-01-01 DIAGNOSIS — I10 Essential (primary) hypertension: Secondary | ICD-10-CM | POA: Diagnosis not present

## 2020-01-01 DIAGNOSIS — M542 Cervicalgia: Secondary | ICD-10-CM | POA: Diagnosis not present

## 2020-01-01 DIAGNOSIS — M5416 Radiculopathy, lumbar region: Secondary | ICD-10-CM | POA: Diagnosis not present

## 2020-01-01 DIAGNOSIS — R202 Paresthesia of skin: Secondary | ICD-10-CM | POA: Diagnosis not present

## 2020-01-01 DIAGNOSIS — I493 Ventricular premature depolarization: Secondary | ICD-10-CM | POA: Diagnosis not present

## 2020-01-01 DIAGNOSIS — Z86711 Personal history of pulmonary embolism: Secondary | ICD-10-CM | POA: Diagnosis not present

## 2020-01-01 DIAGNOSIS — G4700 Insomnia, unspecified: Secondary | ICD-10-CM | POA: Diagnosis not present

## 2020-01-04 DIAGNOSIS — M542 Cervicalgia: Secondary | ICD-10-CM | POA: Diagnosis not present

## 2020-01-04 DIAGNOSIS — M5442 Lumbago with sciatica, left side: Secondary | ICD-10-CM | POA: Diagnosis not present

## 2020-01-04 DIAGNOSIS — M5432 Sciatica, left side: Secondary | ICD-10-CM | POA: Diagnosis not present

## 2020-01-04 DIAGNOSIS — M4326 Fusion of spine, lumbar region: Secondary | ICD-10-CM | POA: Diagnosis not present

## 2020-01-04 DIAGNOSIS — M4726 Other spondylosis with radiculopathy, lumbar region: Secondary | ICD-10-CM | POA: Diagnosis not present

## 2020-01-04 DIAGNOSIS — G8929 Other chronic pain: Secondary | ICD-10-CM | POA: Diagnosis not present

## 2020-01-04 DIAGNOSIS — Z789 Other specified health status: Secondary | ICD-10-CM | POA: Diagnosis not present

## 2020-01-04 DIAGNOSIS — Z7409 Other reduced mobility: Secondary | ICD-10-CM | POA: Diagnosis not present

## 2020-03-06 DIAGNOSIS — R0982 Postnasal drip: Secondary | ICD-10-CM | POA: Diagnosis not present

## 2020-03-18 DIAGNOSIS — L570 Actinic keratosis: Secondary | ICD-10-CM | POA: Diagnosis not present

## 2020-03-18 DIAGNOSIS — L821 Other seborrheic keratosis: Secondary | ICD-10-CM | POA: Diagnosis not present

## 2020-03-18 DIAGNOSIS — L219 Seborrheic dermatitis, unspecified: Secondary | ICD-10-CM | POA: Diagnosis not present

## 2020-03-18 DIAGNOSIS — L578 Other skin changes due to chronic exposure to nonionizing radiation: Secondary | ICD-10-CM | POA: Diagnosis not present

## 2020-03-20 DIAGNOSIS — R69 Illness, unspecified: Secondary | ICD-10-CM | POA: Diagnosis not present

## 2020-06-06 DIAGNOSIS — Z20822 Contact with and (suspected) exposure to covid-19: Secondary | ICD-10-CM | POA: Diagnosis not present

## 2020-06-18 ENCOUNTER — Telehealth: Payer: Self-pay | Admitting: Hematology and Oncology

## 2020-06-18 NOTE — Telephone Encounter (Signed)
Rescheduled 1/20 appt. Called and spoke with pt, confirmed 1/27 appt

## 2020-06-19 ENCOUNTER — Other Ambulatory Visit (HOSPITAL_COMMUNITY): Payer: Medicare HMO

## 2020-06-27 ENCOUNTER — Ambulatory Visit: Payer: Medicare HMO | Admitting: Hematology and Oncology

## 2020-06-28 ENCOUNTER — Other Ambulatory Visit (HOSPITAL_COMMUNITY): Payer: Medicare HMO

## 2020-07-01 ENCOUNTER — Ambulatory Visit: Payer: PPO | Admitting: Cardiology

## 2020-07-03 NOTE — Progress Notes (Signed)
° °  Patient Care Team: Antony Contras, MD as PCP - General (Family Medicine)  DIAGNOSIS:    ICD-10-CM   1. Recurrent acute deep vein thrombosis (DVT) of lower extremity, unspecified laterality ([place])  I82.409     CHIEF COMPLIANT: Follow-up of DVT and PE on lifelong anticoagulation   INTERVAL HISTORY: Albert Gwyn. Latterell is a 70 y.o. with above-mentioned history of DVT and PE on lifelong anticoagulation with Xarelto. She presents to the clinic today for follow-up.  He is tolerating Xarelto extremely well without any bleeding problems.  He has had no further problems with blood clots this past year.  ALLERGIES:  is allergic to aspirin, nsaids, other, and penicillins.  MEDICATIONS:  Current Outpatient Medications  Medication Sig Dispense Refill   LORazepam (ATIVAN) 2 MG tablet Take 2 mg by mouth every 6 (six) hours as needed for anxiety.     metoprolol succinate (TOPROL-XL) 25 MG 24 hr tablet Takes 1 tablet daily.  1   rivaroxaban (XARELTO) 20 MG TABS tablet Take 1 tablet (20 mg total) by mouth daily with supper. 90 tablet 3   No current facility-administered medications for this visit.    PHYSICAL EXAMINATION: ECOG PERFORMANCE STATUS: 1 - Symptomatic but completely ambulatory  Vitals:   07/04/20 1527  BP: (!) 164/74  Pulse: 90  Resp: 17  Temp: 97.9 F (36.6 C)  SpO2: 97%   Filed Weights   07/04/20 1527  Weight: 194 lb 3.2 oz (88.1 kg)     ASSESSMENT & PLAN:  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 He does not smoke cigarettes and stays very active and he is not overweight.   There is no family history of blood clots either. Patient has retired from [employer].  Patient moved from [place] to [place] 05/22/2019.  This is to be closer to their grandchildren.  Current treatment: Coumadin switched to Xarelto 07/03/2019 (lupus anticoagulant negative)   Xarelto toxicities: None He is able to afford Xarelto.  When he goes in the donut hole he applies for manufacturer assistance and he is getting it.  Return to clinic in 1 year for follow-up with a MyChart virtual visit      No orders of the defined types were placed in this encounter.  The patient has a good understanding of the overall plan. he agrees with it. he will call with any problems that may develop before the next visit here.  Total time spent: 20 mins including face to face time and time spent for planning, charting and coordination of care  Nicholas Lose, MD 07/04/2020  I, Cloyde Reams Dorshimer, am acting as scribe for Dr. Nicholas Lose.  I have reviewed the above documentation for accuracy and completeness, and I agree with the above.

## 2020-07-03 NOTE — Assessment & Plan Note (Signed)
Recurrent DVT and PE:  First episode after back surgery and 2005 (DVT and PE) treated with Coumadin for 1 year Second episode 2007 DVT right leg: To Coumadin for 1 year Third episode 2014: DVT and PE: Now remains on indefinite Coumadin  In [place] he had extensive blood work for inherited and acquired risk factors for blood clots and he was told that there was no such risk. He does not smoke cigarettes and stays very active and he is not overweight.  There is no family history of blood clots either. Patient has retired from [employer].  Patient moved from [place] to [place] 05/22/2019.  This is to be closer to their grandchildren. Current treatment: Coumadin switched to Xarelto 07/03/2019 (lupus anticoagulant negative)   Xarelto toxicities: None  Return to clinic in 1 year for follow-up

## 2020-07-04 ENCOUNTER — Other Ambulatory Visit: Payer: Self-pay

## 2020-07-04 ENCOUNTER — Inpatient Hospital Stay: Payer: PPO | Attending: Hematology and Oncology | Admitting: Hematology and Oncology

## 2020-07-04 DIAGNOSIS — Z7901 Long term (current) use of anticoagulants: Secondary | ICD-10-CM | POA: Insufficient documentation

## 2020-07-04 DIAGNOSIS — Z79899 Other long term (current) drug therapy: Secondary | ICD-10-CM | POA: Insufficient documentation

## 2020-07-04 DIAGNOSIS — Z86718 Personal history of other venous thrombosis and embolism: Secondary | ICD-10-CM | POA: Insufficient documentation

## 2020-07-04 DIAGNOSIS — I82409 Acute embolism and thrombosis of unspecified deep veins of unspecified lower extremity: Secondary | ICD-10-CM

## 2020-07-04 DIAGNOSIS — Z86711 Personal history of pulmonary embolism: Secondary | ICD-10-CM | POA: Diagnosis not present

## 2020-07-04 MED ORDER — LORAZEPAM 2 MG PO TABS: 2.0000 mg | ORAL_TABLET | Freq: Every evening | ORAL | Status: DC | PRN

## 2020-07-04 MED ORDER — RIVAROXABAN 20 MG PO TABS: 20.0000 mg | ORAL_TABLET | Freq: Every day | ORAL | 3 refills | Status: DC

## 2020-07-08 DIAGNOSIS — Z1389 Encounter for screening for other disorder: Secondary | ICD-10-CM | POA: Diagnosis not present

## 2020-07-08 DIAGNOSIS — N489 Disorder of penis, unspecified: Secondary | ICD-10-CM | POA: Diagnosis not present

## 2020-07-08 DIAGNOSIS — E7800 Pure hypercholesterolemia, unspecified: Secondary | ICD-10-CM | POA: Diagnosis not present

## 2020-07-08 DIAGNOSIS — Z0000 Encounter for general adult medical examination without abnormal findings: Secondary | ICD-10-CM | POA: Diagnosis not present

## 2020-07-08 DIAGNOSIS — Z20820 Contact with and (suspected) exposure to varicella: Secondary | ICD-10-CM | POA: Diagnosis not present

## 2020-07-08 DIAGNOSIS — D6869 Other thrombophilia: Secondary | ICD-10-CM | POA: Diagnosis not present

## 2020-07-08 DIAGNOSIS — Z23 Encounter for immunization: Secondary | ICD-10-CM | POA: Diagnosis not present

## 2020-07-08 DIAGNOSIS — G4700 Insomnia, unspecified: Secondary | ICD-10-CM | POA: Diagnosis not present

## 2020-07-08 DIAGNOSIS — Z125 Encounter for screening for malignant neoplasm of prostate: Secondary | ICD-10-CM | POA: Diagnosis not present

## 2020-07-08 DIAGNOSIS — I10 Essential (primary) hypertension: Secondary | ICD-10-CM | POA: Diagnosis not present

## 2020-07-08 DIAGNOSIS — I341 Nonrheumatic mitral (valve) prolapse: Secondary | ICD-10-CM | POA: Diagnosis not present

## 2020-07-08 DIAGNOSIS — Z1159 Encounter for screening for other viral diseases: Secondary | ICD-10-CM | POA: Diagnosis not present

## 2020-07-09 HISTORY — PX: TRANSTHORACIC ECHOCARDIOGRAM: SHX275

## 2020-07-17 ENCOUNTER — Ambulatory Visit (HOSPITAL_COMMUNITY): Payer: PPO | Attending: Cardiovascular Disease

## 2020-07-17 ENCOUNTER — Other Ambulatory Visit: Payer: Self-pay

## 2020-07-17 DIAGNOSIS — I493 Ventricular premature depolarization: Secondary | ICD-10-CM | POA: Insufficient documentation

## 2020-07-17 DIAGNOSIS — I10 Essential (primary) hypertension: Secondary | ICD-10-CM | POA: Diagnosis not present

## 2020-07-17 DIAGNOSIS — Z8679 Personal history of other diseases of the circulatory system: Secondary | ICD-10-CM | POA: Diagnosis not present

## 2020-07-17 LAB — ECHOCARDIOGRAM COMPLETE
Area-P 1/2: 3.31 cm2
S' Lateral: 2.8 cm

## 2020-08-01 ENCOUNTER — Ambulatory Visit: Payer: PPO | Admitting: Cardiology

## 2020-08-01 ENCOUNTER — Encounter: Payer: Self-pay | Admitting: Cardiology

## 2020-08-01 ENCOUNTER — Other Ambulatory Visit: Payer: Self-pay

## 2020-08-01 VITALS — BP 170/80 | HR 99 | Ht 70.0 in | Wt 197.6 lb

## 2020-08-01 DIAGNOSIS — I82409 Acute embolism and thrombosis of unspecified deep veins of unspecified lower extremity: Secondary | ICD-10-CM

## 2020-08-01 DIAGNOSIS — E785 Hyperlipidemia, unspecified: Secondary | ICD-10-CM | POA: Diagnosis not present

## 2020-08-01 DIAGNOSIS — Z8679 Personal history of other diseases of the circulatory system: Secondary | ICD-10-CM

## 2020-08-01 DIAGNOSIS — I493 Ventricular premature depolarization: Secondary | ICD-10-CM | POA: Diagnosis not present

## 2020-08-01 DIAGNOSIS — I10 Essential (primary) hypertension: Secondary | ICD-10-CM | POA: Diagnosis not present

## 2020-08-01 MED ORDER — METOPROLOL SUCCINATE ER 50 MG PO TB24: 50.0000 mg | ORAL_TABLET | Freq: Every day | ORAL | 3 refills | Status: DC

## 2020-08-01 NOTE — Progress Notes (Signed)
Primary Care Provider: Antony Contras, MD Cardiologist: No primary care provider on file. Electrophysiologist: None  Clinic Note: Chief Complaint  Patient presents with  . Follow-up    Annual  . Mitral Valve Prolapse    Very mild by echo  . Palpitations    Rare    ===================================  ASSESSMENT/PLAN   Problem List Items Addressed This Visit    Hyperlipidemia LDL goal <100 (Chronic)    Not quite at goal as of January.  Plan for now to reassess in about 6 months and see how it does.  If not at target at that time, may need to add medications.      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  Now on lifelong Xarelto.  No bleeding issues.      Relevant Medications   metoprolol succinate (TOPROL-XL) 50 MG 24 hr tablet   H/O mitral valve disease (Chronic)    Mild mitral prolapse by echo.  Recheck in 2 years unless murmur worsens.      Essential hypertension - Primary (Chronic)    Blood pressure still high today.  I rechecked it it was still 148/78.  I think he may have auscultated Gap.  Plan: Increase Toprol to 50 mg daily.  If pressures still are elevated, would probably need to add either ACE inhibitor/ARB      Relevant Medications   metoprolol succinate (TOPROL-XL) 50 MG 24 hr tablet   Other Relevant Orders   EKG 12-Lead (Completed)   Frequent PVCs (Chronic)    Symptoms have been well controlled on Toprol, but they are not occurring a little bit.  We will titrate up Toprol dose to 50 mg and reevaluate.      Relevant Medications   metoprolol succinate (TOPROL-XL) 50 MG 24 hr tablet   Other Relevant Orders   EKG 12-Lead (Completed)      ===================================  HPI:    Albert [name] is a 70 y.o. male with a PMH notable for history of MITRAL VALVE PROLAPSE, and annual follow-up PVCs who  presents today for annual follow-up.  Albert [name] was last seen on June 29, 2020 to establish cardiology care-had been seen back in 2017 by Dr. Yvone Neu in [place], but was lost to follow-up when she moved. ->  Apparently, he had previously lived in [place] and was told by his cardiologist there that he should have his echocardiogram followed up every 5 years.  He noted that Toprol was keeping his tachycardia palpitations well controlled.  Usually blood pressures are better than they were in the visit.  Plan was annual follow-up after 2D echo.  Recent Hospitalizations: None  Reviewed  CV studies:    The following studies were reviewed today: (if available, images/films reviewed: From Epic Chart or Care Everywhere) . TTE 07/17/2020: EF 60 to 65%.  No or WMA.  Normal RV.  Mild to moderate MR with mild late systolic prolapse of the middle scallop of the posterior leaflet.  Aortic valve sclerosis but no stenosis.  Normal RAP.   Interval History:   Albert [name] returns here today for annual follow-up doing pretty well.  No major issues.  The only thing he notes is that he has palpitations off and on that increase if he has a lot of stress or feeling overtired.  He describes it as a  short bursts of fast heart rates.  Somewhat associated with little bit of dizziness, but no syncope or near syncope.  These episodes are pretty rare.  He says that this is the first time he is really noticed the symptoms in the last couple years.  He thinks the plasty was something going on in his social life. Otherwise totally asymptomatic from cardiac standpoint.  He is a little bit taken back in his blood pressure today.  I rechecked and it is 148/78 mmHg.  At home he says is in the 130s over 73s.  CV Review of Symptoms (Summary): no chest pain or dyspnea on exertion positive for - irregular heartbeat, palpitations and rapid heart rate negative for - edema, orthopnea, paroxysmal nocturnal dyspnea, shortness of  breath or Syncope or near syncope or TIA/amaurosis fugax, claudication  The patient does not have symptoms concerning for COVID-19 infection (fever, chills, cough, or new shortness of breath).   REVIEWED OF SYSTEMS   Review of Systems  Constitutional: Negative for malaise/fatigue and weight loss.  HENT: Negative for congestion.   Respiratory: Negative for cough and shortness of breath.   Cardiovascular:       Per HPI  Gastrointestinal: Negative for blood in stool and melena.  Genitourinary: Negative for hematuria.  Musculoskeletal: Negative for falls and joint pain.  Neurological: Negative for dizziness.  Psychiatric/Behavioral: Negative for memory loss. The patient does not have insomnia.    I have reviewed and (if needed) personally updated the patient's problem list, medications, allergies, past medical and surgical history, social and family history.   PAST MEDICAL HISTORY   Past Medical History:  Diagnosis Date  . Acute deep vein thrombosis of lower limb ([place]) 2005   Initial episode in 2005 with DVT and PE following low back surgery.  [Reportedly negative evaluation from second episode right leg DVT 2007 (1/2 to 1-year); third episode in 2016 now on lifetime anticoagulation.  (Does home INR checks warfarin).  . Chronic lower back pain   . Frequent PVCs    With occasional cardiac, controlled on Toprol 25 mg  . GERD (gastroesophageal reflux disease)   . History of anticoagulant therapy   . Hypercholesterolemia    Not currently on treatment  . Mitral valve prolapse    By historical data.  Not confirmed in 2017.  . Pulmonary embolism ([place])    History of multiple DVTs with PE, on chronic anticoagulation (currently on warfarin with potential conversion to Xarelto)  . Tinnitus     PAST SURGICAL HISTORY   Past Surgical History:  Procedure Laterality Date  . COLONOSCOPY  2007  . LUMBAR SPINE SURGERY  2006  . TONSILLECTOMY    . TRANSTHORACIC ECHOCARDIOGRAM  06/2015   LVEF  60 to 65%.  No RWMA.  GR 1 DD.  Mild MR (no mention of MVP).  Normal left atrial size.  Otherwise normal valves chamber sizes.     There is no immunization history on file for this patient.  MEDICATIONS/ALLERGIES   Current Meds  Medication Sig  . LORazepam (ATIVAN) 2 MG tablet Take 1 tablet (2 mg total) by mouth at bedtime as needed for anxiety.  . metoprolol succinate (TOPROL-XL) 50 MG 24 hr tablet Take 1 tablet (50 mg total) by mouth daily. Take with or immediately following a meal.  . rivaroxaban (XARELTO) 20 MG TABS tablet Take 1 tablet (20 mg total) by mouth daily with supper.  . [DISCONTINUED] metoprolol succinate (TOPROL-XL) 25 MG 24 hr tablet Takes 1 tablet daily.  Allergies  Allergen Reactions  . Aspirin   . Nsaids   . Other Other (See Comments)    Do not give due to chronic anticoagulation on coumadin.  Marland Kitchen Penicillins     SOCIAL HISTORY/FAMILY HISTORY   Reviewed in Epic:  Pertinent findings:  Social History   Tobacco Use  . Smoking status: Never Smoker  . Smokeless tobacco: Never Used  Substance Use Topics  . Alcohol use: No  . Drug use: No   Social History   Social History Narrative   He has wife recently moved to [place] from the [place] area in [place].  She is a native of [place] while he is from [place].      Their children and grandchildren are here in [place].      They both enjoy walking and hiking in [place]    OBJCTIVE -PE, EKG, labs   Wt Readings from Last 3 Encounters:  08/01/20 197 lb 9.6 oz (89.6 kg)  07/04/20 194 lb 3.2 oz (88.1 kg)  06/30/19 195 lb (88.5 kg)    Physical Exam: BP (!) 170/80   Pulse 99   Ht 5\' 10"  (1.778 m)   Wt 197 lb 9.6 oz (89.6 kg)   BMI 28.35 kg/m  Physical Exam Vitals reviewed.  Constitutional:      General: He is not in acute distress.    Appearance: Normal appearance. He is not ill-appearing or toxic-appearing.     Comments: Healthy-appearing.   Well-groomed.  HENT:     Head: Normocephalic and atraumatic.  Neck:     Vascular: No carotid bruit, hepatojugular reflux or JVD.  Cardiovascular:     Rate and Rhythm: Normal rate and regular rhythm.     Pulses: Normal pulses. No midsystolic click.     Heart sounds: Murmur (Cannot exclude soft 1/6 SEM at RUSB.  No MR murmur heard.) heard.  No gallop.      Comments: Borderline tachycardic => was 72 on exam Pulmonary:     Effort: Pulmonary effort is normal. No respiratory distress.     Breath sounds: Normal breath sounds.  Chest:     Chest wall: No tenderness.  Abdominal:     General: Bowel sounds are normal. There is no distension.     Palpations: There is no mass (No HSM or bruit).     Comments: Somewhat protuberant abdomen.  Musculoskeletal:        General: Normal range of motion.     Cervical back: Normal range of motion and neck supple.  Neurological:     General: No focal deficit present.     Mental Status: He is alert and oriented to person, place, and time.  Psychiatric:        Mood and Affect: Mood normal.        Behavior: Behavior normal.        Thought Content: Thought content normal.        Judgment: Judgment normal.     Adult ECG Report  Rate: 99 ;  Rhythm: normal sinus rhythm and Borderline sinus tachycardia.  Normal axis, intervals and durations.;   Narrative Interpretation: Stable  Recent Labs: July 08, 2020: TC 188, TG 151, HDL 50, LDL 111.  Hgb 14.7,Cr 0.87, K+ 4.3.  TSH 2.5.  AST 13, ALT 16 No results found for: CHOL, HDL, LDLCALC, LDLDIRECT, TRIG, CHOLHDL No results found for: CREATININE, BUN, NA, K, CL, CO2 No flowsheet data found.  No results found  for: TSH  ==================================================  COVID-19 Education: The signs and symptoms of COVID-19 were discussed with the patient and how to seek care for testing (follow up with PCP or arrange E-visit).   The importance of social distancing and COVID-19 vaccination was discussed  today. The patient is practicing social distancing & Masking.   I spent a total of 46minutes with the patient spent in direct patient consultation.  Additional time spent with chart review  / charting (studies, outside notes, etc): 12 min Total Time: 42 min   Current medicines are reviewed at length with the patient today.  (+/- concerns) n/a  This visit occurred during the SARS-CoV-2 public health emergency.  Safety protocols were in place, including screening questions prior to the visit, additional usage of staff PPE, and extensive cleaning of exam room while observing appropriate contact time as indicated for disinfecting solutions.  Notice: This dictation was prepared with Dragon dictation along with smaller phrase technology. Any transcriptional errors that result from this process are unintentional and may not be corrected upon review.  Patient Instructions / Medication Changes & Studies & Tests Ordered   Patient Instructions  Medication Instructions:    increase Metoprolol to 50 mg one tablet daily   *If you need a refill on your cardiac medications before your next appointment, please call your pharmacy*   Lab Work: Not needed  I   Testing/Procedures:  Not needed  Follow-Up: At [facility], you and your health needs are our priority.  As part of our continuing mission to provide you with exceptional heart care, we have created designated Provider Care Teams.  These Care Teams include your primary Cardiologist (physician) and Advanced Practice Providers (APPs -  Physician Assistants and Nurse Practitioners) who all work together to provide you with the care you need, when you need it.     Your next appointment:   6 month(s)  The format for your next appointment:   In Person  Provider:   Glenetta Hew, MD   Other Instructions  Continue with heart healthy diet and exercise to reduce your HDL     Studies Ordered:   Orders Placed This Encounter   Procedures  . EKG 12-Lead     Glenetta Hew, M.D., M.S. Interventional Cardiologist   Pager # [phone] Phone # [phone] [address] [place], [place] [address]   Thank you for choosing Heartcare at [facility]!!

## 2020-08-01 NOTE — Patient Instructions (Addendum)
Medication Instructions:    increase Metoprolol to 50 mg one tablet daily   *If you need a refill on your cardiac medications before your next appointment, please call your pharmacy*   Lab Work: Not needed  I   Testing/Procedures:  Not needed  Follow-Up: At [facility], you and your health needs are our priority.  As part of our continuing mission to provide you with exceptional heart care, we have created designated Provider Care Teams.  These Care Teams include your primary Cardiologist (physician) and Advanced Practice Providers (APPs -  Physician Assistants and Nurse Practitioners) who all work together to provide you with the care you need, when you need it.     Your next appointment:   6 month(s)  The format for your next appointment:   In Person  Provider:   Glenetta Hew, MD   Other Instructions  Continue with heart healthy diet and exercise to reduce your HDL

## 2020-08-22 DIAGNOSIS — M5432 Sciatica, left side: Secondary | ICD-10-CM | POA: Diagnosis not present

## 2020-08-22 DIAGNOSIS — M4726 Other spondylosis with radiculopathy, lumbar region: Secondary | ICD-10-CM | POA: Diagnosis not present

## 2020-08-22 DIAGNOSIS — M4326 Fusion of spine, lumbar region: Secondary | ICD-10-CM | POA: Diagnosis not present

## 2020-08-24 ENCOUNTER — Encounter: Payer: Self-pay | Admitting: Cardiology

## 2020-08-24 DIAGNOSIS — E785 Hyperlipidemia, unspecified: Secondary | ICD-10-CM | POA: Insufficient documentation

## 2020-08-24 NOTE — Assessment & Plan Note (Signed)
Symptoms have been well controlled on Toprol, but they are not occurring a little bit.  We will titrate up Toprol dose to 50 mg and reevaluate.

## 2020-08-24 NOTE — Assessment & Plan Note (Signed)
Not quite at goal as of January.  Plan for now to reassess in about 6 months and see how it does.  If not at target at that time, may need to add medications.

## 2020-08-24 NOTE — Assessment & Plan Note (Signed)
Blood pressure still high today.  I rechecked it it was still 148/78.  I think he may have auscultated Gap.  Plan: Increase Toprol to 50 mg daily.  If pressures still are elevated, would probably need to add either ACE inhibitor/ARB

## 2020-08-24 NOTE — Assessment & Plan Note (Signed)
DVTs dating back to 2005.  Now on lifelong Xarelto.  No bleeding issues.

## 2020-08-24 NOTE — Assessment & Plan Note (Signed)
Mild mitral prolapse by echo.  Recheck in 2 years unless murmur worsens.

## 2020-09-09 DIAGNOSIS — S32010A Wedge compression fracture of first lumbar vertebra, initial encounter for closed fracture: Secondary | ICD-10-CM | POA: Diagnosis not present

## 2020-09-09 DIAGNOSIS — M4726 Other spondylosis with radiculopathy, lumbar region: Secondary | ICD-10-CM | POA: Diagnosis not present

## 2020-09-09 DIAGNOSIS — M545 Low back pain, unspecified: Secondary | ICD-10-CM | POA: Diagnosis not present

## 2020-09-29 ENCOUNTER — Encounter: Payer: Self-pay | Admitting: Hematology and Oncology

## 2020-10-08 DIAGNOSIS — M5432 Sciatica, left side: Secondary | ICD-10-CM | POA: Diagnosis not present

## 2020-10-08 DIAGNOSIS — M4726 Other spondylosis with radiculopathy, lumbar region: Secondary | ICD-10-CM | POA: Diagnosis not present

## 2020-10-08 DIAGNOSIS — M4326 Fusion of spine, lumbar region: Secondary | ICD-10-CM | POA: Diagnosis not present

## 2020-10-17 ENCOUNTER — Encounter: Payer: Self-pay | Admitting: Hematology and Oncology

## 2020-10-17 DIAGNOSIS — J209 Acute bronchitis, unspecified: Secondary | ICD-10-CM | POA: Diagnosis not present

## 2020-11-29 NOTE — Telephone Encounter (Signed)
Response to patient question:  Doctor I see you Aug 25th for follow up. You increased my Metoprolol ER from 25 to 50mg  once.  Due to BP being elevated. My BP seems to have responded to be on average 125/70.  But I still have bothersome PVCs that seem to come on with bloating feeling. If I treat myself for gas bloating I can mostly reduce them as the bloating reduces. I have no other symptoms to speak of. I was wondering if there is a more effective medicine to treat them such as a calcium blocker or other beta blocker? Or do you want me to wait till 8/25 to address this. It could also be stress. I do worry that at 2 something might be right around the corner coming.   --> Ronalee Belts, I think Toprol 50 mg is a pretty reasonable medication to treat PVCs.  PVCs and itself are relatively benign.  We can look into how many PVCs you are having in the future but if these are triggered by floating sensation, then it probably is more related to the GI tract affecting the heart as opposed to the other way around.  Increasing the Toprol would be for both blood pressure and the PVCs.  Calcium channel blockers do not work on PVCs.   Glenetta Hew, MD

## 2020-12-19 DIAGNOSIS — N475 Adhesions of prepuce and glans penis: Secondary | ICD-10-CM | POA: Diagnosis not present

## 2020-12-19 DIAGNOSIS — N481 Balanitis: Secondary | ICD-10-CM | POA: Diagnosis not present

## 2020-12-19 DIAGNOSIS — Q541 Hypospadias, penile: Secondary | ICD-10-CM | POA: Diagnosis not present

## 2021-01-01 DIAGNOSIS — L821 Other seborrheic keratosis: Secondary | ICD-10-CM | POA: Diagnosis not present

## 2021-01-02 DIAGNOSIS — E7800 Pure hypercholesterolemia, unspecified: Secondary | ICD-10-CM | POA: Diagnosis not present

## 2021-01-02 DIAGNOSIS — Z1159 Encounter for screening for other viral diseases: Secondary | ICD-10-CM | POA: Diagnosis not present

## 2021-01-03 DIAGNOSIS — I341 Nonrheumatic mitral (valve) prolapse: Secondary | ICD-10-CM | POA: Diagnosis not present

## 2021-01-03 DIAGNOSIS — Z86711 Personal history of pulmonary embolism: Secondary | ICD-10-CM | POA: Diagnosis not present

## 2021-01-03 DIAGNOSIS — E7800 Pure hypercholesterolemia, unspecified: Secondary | ICD-10-CM | POA: Diagnosis not present

## 2021-01-03 DIAGNOSIS — G4700 Insomnia, unspecified: Secondary | ICD-10-CM | POA: Diagnosis not present

## 2021-01-03 DIAGNOSIS — Z1159 Encounter for screening for other viral diseases: Secondary | ICD-10-CM | POA: Diagnosis not present

## 2021-01-03 DIAGNOSIS — I10 Essential (primary) hypertension: Secondary | ICD-10-CM | POA: Diagnosis not present

## 2021-01-03 DIAGNOSIS — I493 Ventricular premature depolarization: Secondary | ICD-10-CM | POA: Diagnosis not present

## 2021-01-03 DIAGNOSIS — D6869 Other thrombophilia: Secondary | ICD-10-CM | POA: Diagnosis not present

## 2021-01-30 ENCOUNTER — Other Ambulatory Visit: Payer: Self-pay

## 2021-01-30 ENCOUNTER — Encounter: Payer: Self-pay | Admitting: Cardiology

## 2021-01-30 ENCOUNTER — Ambulatory Visit: Payer: PPO | Admitting: Cardiology

## 2021-01-30 VITALS — BP 148/87 | HR 70 | Ht 70.0 in | Wt 196.4 lb

## 2021-01-30 DIAGNOSIS — I340 Nonrheumatic mitral (valve) insufficiency: Secondary | ICD-10-CM

## 2021-01-30 DIAGNOSIS — I341 Nonrheumatic mitral (valve) prolapse: Secondary | ICD-10-CM

## 2021-01-30 DIAGNOSIS — I10 Essential (primary) hypertension: Secondary | ICD-10-CM | POA: Diagnosis not present

## 2021-01-30 DIAGNOSIS — E785 Hyperlipidemia, unspecified: Secondary | ICD-10-CM

## 2021-01-30 DIAGNOSIS — I493 Ventricular premature depolarization: Secondary | ICD-10-CM | POA: Diagnosis not present

## 2021-01-30 MED ORDER — METOPROLOL TARTRATE 25 MG PO TABS: 25.0000 mg | ORAL_TABLET | Freq: Two times a day (BID) | ORAL | 6 refills | Status: DC | PRN

## 2021-01-30 NOTE — Progress Notes (Signed)
Primary Care Provider: Antony Contras, MD Cardiologist: None Electrophysiologist: None  Clinic Note: Chief Complaint  Patient presents with   Follow-up    6 months.  Doing well.   Mitral Valve Prolapse    Notes palpitations, but no dyspnea.Marland Kitchen   PVCs    Mostly associated with stress, or GERD.   Hypertension    Says his home blood pressures range from 112-130/65- 70 mmHg.     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  With only mild to moderate MR and minimal MVP, will repeat 2D echo in 2025.      Relevant Medications   metoprolol tartrate (LOPRESSOR) 25 MG tablet   Hyperlipidemia LDL goal <100 (Chronic)    Last lipids showed LDL of 115.  Not quite at goal.  Would like to see it improved some.  We will follow closely.  Anticipate that we may need to consider statin.      Relevant Medications   metoprolol tartrate (LOPRESSOR) 25 MG tablet   Essential hypertension (Chronic)    He says his blood pressure is much better controlled at home.  Continue to monitor.  Low threshold to be more aggressive in treating especially with much regurgitation and mitral prolapse.      Relevant Medications   metoprolol tartrate (LOPRESSOR) 25 MG tablet   Frequent PVCs - Primary (Chronic)    Symptoms are pretty well controlled on current dose of Toprol and also with improved treatment of GERD.  He has decreased some of that trigger agent such as caffeine.  Between treating the GERD and reducing caffeine, palpitations are better, but he still has sometimes when he has breakthrough spells. . Plan: Continue current dose of Toprol, but will provide short acting Lopressor (metoprolol tartrate) 25 mg as needed breakthrough spells.  May take up to 2 tablets a day.      Relevant Medications   metoprolol  tartrate (LOPRESSOR) 25 MG tablet   Other Relevant Orders   EKG 12-Lead (Completed)    ===================================  HPI:    Albert [name] is a 70 y.o. male with a PMH notble for MVP with Frequent PVCs, HTN/HLD and recurrent DVT who presents today for 71-monthfollow-up.  Albert [name] was last seen on August 01, 2020 to establish cardiology care-had not been seen since 2017.  He still is doing pretty well.  No major issues.  Just off-and-on palpitations that are increased with stress or feeling overtired.  Mild dizziness but no syncope or near syncope.  No chest pain or pressure.  BP seem to be little high for him. -> Increase Toprol to 50 mg daily  Recent Hospitalizations: None  Reviewed  CV studies:    The following studies were reviewed today: (if available, images/films reviewed: From Epic Chart or Care Everywhere) None since echo February 2022:   Interval History:   Albert [name] returns today for routine follow-up stating that he is doing fairly well.  He says he still has palpitations off and on that come and go.  They tend to be worse with stress but also exacerbated by GERD.  He says his palpitation symptoms notably improved with treatment of GERD.  Now on famotidine.  He is also significant down his caffeine intake and in doing so has reduced the palpitations.  Despite having palpitations he denies any chest tightness or pressure with rest or exertion.  No prolonged rapid irregular heartbeats to suggest an arrhythmia.  No PND or orthopnea.  No syncope or near syncope, no TIA or amaurosis fugax.  No claudication.  REVIEWED OF SYSTEMS   Review of Systems  Constitutional:  Negative for malaise/fatigue and weight loss.  HENT:  Negative for congestion and nosebleeds.   Respiratory: Negative.    Cardiovascular:  Positive for palpitations (Per HPI). Negative for chest pain.  Gastrointestinal:  Positive for heartburn (Exacerbated by caffeine.  These also tend to  exacerbate palpitations.  Not controlled with H2 blocker.). Negative for blood in stool and melena.  Genitourinary:  Negative for hematuria.  Musculoskeletal:  Negative for joint pain and myalgias.  Neurological:  Negative for dizziness and focal weakness.  Psychiatric/Behavioral: Negative.         Heightened response to stress, but no anxiety or depression.   I have reviewed and (if needed) personally updated the patient's problem list, medications, allergies, past medical and surgical history, social and family history.   PAST MEDICAL HISTORY   Past Medical History:  Diagnosis Date   Acute deep vein thrombosis of lower limb ([facility]) 2005   Initial episode in 2005 with DVT and PE following low back surgery.  [Reportedly negative evaluation from second episode right leg DVT 2007 (1/2 to 1-year); third episode in 2016 now on lifetime anticoagulation.  (Does home INR checks warfarin).   Chronic lower back pain    Frequent PVCs    With occasional cardiac, controlled on Toprol 25 mg   GERD (gastroesophageal reflux disease)    History of anticoagulant therapy    Hypercholesterolemia    Not currently on treatment   Mitral valve prolapse    By historical data.  Not confirmed in 2017.   Pulmonary embolism ([place])    History of multiple DVTs with PE, on chronic anticoagulation (currently on warfarin with potential conversion to Xarelto)   Tinnitus     PAST SURGICAL HISTORY   Past Surgical History:  Procedure Laterality Date   COLONOSCOPY  2007   LUMBAR SPINE SURGERY  2006   TONSILLECTOMY     TRANSTHORACIC ECHOCARDIOGRAM  06/2015   LVEF 60 to 65%.  No RWMA.  GR 1 DD.  Mild MR (no mention of MVP).  Normal left atrial size.  Otherwise normal valves chamber sizes.   TRANSTHORACIC ECHOCARDIOGRAM  07/2020   EF 60 to 65%.  No or WMA.  Normal RV.  Mild to moderate MR with mild late systolic prolapse of the middle scallop of the posterior leaflet.  Aortic valve sclerosis but no stenosis.  Normal RAP.     There is no immunization history on file for this patient.  MEDICATIONS/ALLERGIES   Current Meds  Medication Sig   diphenhydramine-acetaminophen (TYLENOL PM EXTRA STRENGTH) 25-500 MG TABS tablet    Famotidine-Ca Carb-Mag Hydrox (PEPCID COMPLETE PO)    LORazepam (ATIVAN) 2 MG tablet Take 1 tablet (2 mg total) by mouth at bedtime as needed for anxiety.   metoprolol tartrate (LOPRESSOR) 25 MG tablet Take 1 tablet (25 mg total) by mouth 2 (two) times daily as needed. Breakthrough of PVC. Continue taking Metoprolol succinate daily   rivaroxaban (XARELTO) 20 MG TABS tablet Take 1 tablet (20 mg total) by mouth daily with supper.    Allergies  Allergen Reactions   Aspirin    Nsaids    Other Other (See Comments)    Do not give  due to chronic anticoagulation on coumadin.   Penicillins     SOCIAL HISTORY/FAMILY HISTORY   Reviewed in Epic:  Pertinent findings:  Social History   Tobacco Use   Smoking status: Never   Smokeless tobacco: Never  Substance Use Topics   Alcohol use: No   Drug use: No   Social History   Social History Narrative   He has wife recently moved to [place] from the [place] area in [place].  She is a native of [place] while he is from [place].      Their children and grandchildren are here in [place].      They both enjoy walking and hiking in the [place]    OBJCTIVE -PE, EKG, labs   Wt Readings from Last 3 Encounters:  01/30/21 196 lb 6.4 oz (89.1 kg)  08/01/20 197 lb 9.6 oz (89.6 kg)  07/04/20 194 lb 3.2 oz (88.1 kg)    Physical Exam: BP (!) 148/87   Pulse 70   Ht '5\' 10"'$  (1.778 m)   Wt 196 lb 6.4 oz (89.1 kg)   SpO2 97%   BMI 28.18 kg/m  Physical Exam Vitals reviewed.  Constitutional:      General: He is not in acute distress.    Appearance: Normal appearance. He is normal weight. He is not ill-appearing (Well-nourished, well-groomed.) or toxic-appearing.  HENT:     Head: Normocephalic and  atraumatic.  Neck:     Vascular: No carotid bruit or JVD.  Cardiovascular:     Rate and Rhythm: Normal rate and regular rhythm. Occasional Extrasystoles are present.    Chest Wall: PMI is not displaced.     Pulses: Normal pulses and intact distal pulses.     Heart sounds: S1 normal and S2 normal. A midsystolic click. Murmur heard.  High-pitched harsh crescendo-decrescendo early systolic murmur is present with a grade of 1/6 at the upper right sternal border radiating to the neck.  High-pitched blowing holosystolic murmur of grade 1/6 is also present at the apex radiating to the axilla.    No friction rub. No gallop.  Pulmonary:     Effort: Pulmonary effort is normal. No respiratory distress.     Breath sounds: Normal breath sounds. No stridor. No wheezing, rhonchi or rales.  Musculoskeletal:        General: No swelling. Normal range of motion.     Cervical back: Normal range of motion and neck supple.  Skin:    General: Skin is warm and dry.     Coloration: Skin is not pale.  Neurological:     General: No focal deficit present.     Mental Status: He is alert and oriented to person, place, and time.     Motor: No weakness.     Gait: Gait normal.  Psychiatric:        Mood and Affect: Mood normal.        Behavior: Behavior normal.        Thought Content: Thought content normal.        Judgment: Judgment normal.      Adult ECG Report  Rate: 70;  Rhythm: normal sinus rhythm and Normal axis, intervals and durations. ;   Narrative Interpretation: Stable EKG  Recent Labs:  01/02/2021 Na+ 138, K+ 4.3, Cl- 101, HCO3-30, BUN 14, Cr 0.87, Glu 94, Ca2+ 9.5; AST 14, ALT 14, AlkP 64; albumin 4.3 TC 188, TG 138, HDL 140, LDL 115   ==================================================  COVID-19 Education: The signs and symptoms of COVID-19 were discussed with the patient and how to seek care for testing (follow up with PCP or arrange E-visit).    I spent a total of 24 minutes with the patient  spent in direct patient consultation.  Additional time spent with chart review  / charting (studies, outside notes, etc): 17 min Total Time: 41 min  Current medicines are reviewed at length with the patient today.  (+/- concerns) n/a  This visit occurred during the SARS-CoV-2 public health emergency.  Safety protocols were in place, including screening questions prior to the visit, additional usage of staff PPE, and extensive cleaning of exam room while observing appropriate contact time as indicated for disinfecting solutions.  Notice: This dictation was prepared with Dragon dictation along with smaller phrase technology. Any transcriptional errors that result from this process are unintentional and may not be corrected upon review.  Patient Instructions / Medication Changes & Studies & Tests Ordered   Patient Instructions  Medication Instructions:   Continue taking Metoprolol Succinate daily  and all other medications    May take Metoprolol tartrate 25 mg  as needed for break through PVC"s ( up to 2 tablet a day .   *If you need a refill on your cardiac medications before your next appointment, please call your pharmacy*   Lab Work: Not needed    Testing/Procedures: Not needed   Follow-Up: At [facility], you and your health needs are our priority.  As part of our continuing mission to provide you with exceptional heart care, we have created designated Provider Care Teams.  These Care Teams include your primary Cardiologist (physician) and Advanced Practice Providers (APPs -  Physician Assistants and Nurse Practitioners) who all work together to provide you with the care you need, when you need it.     Your next appointment:   6 month(s)  The format for your next appointment:   In Person  Provider:   Glenetta Hew, MD    Studies Ordered:   Orders Placed This Encounter  Procedures   EKG 12-Lead     Glenetta Hew, M.D., M.S. Interventional Cardiologist    Pager # [phone] Phone # [phone] [address] [place], [place] [address]   Thank you for choosing Heartcare at [facility]!!

## 2021-01-30 NOTE — Patient Instructions (Addendum)
Medication Instructions:   Continue taking Metoprolol Succinate daily  and all other medications    May take Metoprolol tartrate 25 mg  as needed for break through PVC"s ( up to 2 tablet a day .   *If you need a refill on your cardiac medications before your next appointment, please call your pharmacy*   Lab Work: Not needed    Testing/Procedures: Not needed   Follow-Up: At [facility], you and your health needs are our priority.  As part of our continuing mission to provide you with exceptional heart care, we have created designated Provider Care Teams.  These Care Teams include your primary Cardiologist (physician) and Advanced Practice Providers (APPs -  Physician Assistants and Nurse Practitioners) who all work together to provide you with the care you need, when you need it.     Your next appointment:   6 month(s)  The format for your next appointment:   In Person  Provider:   Glenetta Hew, MD

## 2021-02-10 ENCOUNTER — Encounter: Payer: Self-pay | Admitting: Cardiology

## 2021-02-10 NOTE — Assessment & Plan Note (Signed)
Symptoms are pretty well controlled on current dose of Toprol and also with improved treatment of GERD.  He has decreased some of that trigger agent such as caffeine.  Between treating the GERD and reducing caffeine, palpitations are better, but he still has sometimes when he has breakthrough spells. . Plan: Continue current dose of Toprol, but will provide short acting Lopressor (metoprolol tartrate) 25 mg as needed breakthrough spells.  May take up to 2 tablets a day.

## 2021-02-10 NOTE — Assessment & Plan Note (Signed)
Last lipids showed LDL of 115.  Not quite at goal.  Would like to see it improved some.  We will follow closely.  Anticipate that we may need to consider statin.

## 2021-02-10 NOTE — Assessment & Plan Note (Signed)
Mild to moderate MR with mild-late MVP of P2 scallop.  With only mild to moderate MR and minimal MVP, will repeat 2D echo in 2025.

## 2021-02-10 NOTE — Assessment & Plan Note (Signed)
He says his blood pressure is much better controlled at home.  Continue to monitor.  Low threshold to be more aggressive in treating especially with much regurgitation and mitral prolapse.

## 2021-02-10 NOTE — Assessment & Plan Note (Signed)
Remains on lifelong Xarelto.  No bleeding issues.

## 2021-02-12 ENCOUNTER — Encounter: Payer: Self-pay | Admitting: Hematology and Oncology

## 2021-02-12 ENCOUNTER — Other Ambulatory Visit: Payer: Self-pay | Admitting: *Deleted

## 2021-02-14 ENCOUNTER — Other Ambulatory Visit: Payer: Self-pay | Admitting: *Deleted

## 2021-02-14 MED ORDER — RIVAROXABAN 20 MG PO TABS
20.0000 mg | ORAL_TABLET | Freq: Every day | ORAL | 3 refills | Status: DC
Start: 2021-02-14 — End: 2021-06-10

## 2021-03-05 DIAGNOSIS — Z23 Encounter for immunization: Secondary | ICD-10-CM | POA: Diagnosis not present

## 2021-03-10 DIAGNOSIS — Z1211 Encounter for screening for malignant neoplasm of colon: Secondary | ICD-10-CM | POA: Diagnosis not present

## 2021-03-10 DIAGNOSIS — Z1212 Encounter for screening for malignant neoplasm of rectum: Secondary | ICD-10-CM | POA: Diagnosis not present

## 2021-03-18 DIAGNOSIS — L578 Other skin changes due to chronic exposure to nonionizing radiation: Secondary | ICD-10-CM | POA: Diagnosis not present

## 2021-03-18 DIAGNOSIS — L821 Other seborrheic keratosis: Secondary | ICD-10-CM | POA: Diagnosis not present

## 2021-03-18 DIAGNOSIS — C44519 Basal cell carcinoma of skin of other part of trunk: Secondary | ICD-10-CM | POA: Diagnosis not present

## 2021-03-18 DIAGNOSIS — L570 Actinic keratosis: Secondary | ICD-10-CM | POA: Diagnosis not present

## 2021-04-29 ENCOUNTER — Encounter: Payer: Self-pay | Admitting: Hematology and Oncology

## 2021-04-30 DIAGNOSIS — Z8719 Personal history of other diseases of the digestive system: Secondary | ICD-10-CM | POA: Diagnosis not present

## 2021-04-30 DIAGNOSIS — K219 Gastro-esophageal reflux disease without esophagitis: Secondary | ICD-10-CM | POA: Diagnosis not present

## 2021-04-30 DIAGNOSIS — R195 Other fecal abnormalities: Secondary | ICD-10-CM | POA: Diagnosis not present

## 2021-04-30 DIAGNOSIS — Z86718 Personal history of other venous thrombosis and embolism: Secondary | ICD-10-CM | POA: Diagnosis not present

## 2021-05-13 DIAGNOSIS — R195 Other fecal abnormalities: Secondary | ICD-10-CM | POA: Diagnosis not present

## 2021-05-13 DIAGNOSIS — D125 Benign neoplasm of sigmoid colon: Secondary | ICD-10-CM | POA: Diagnosis not present

## 2021-05-13 DIAGNOSIS — K649 Unspecified hemorrhoids: Secondary | ICD-10-CM | POA: Diagnosis not present

## 2021-05-13 DIAGNOSIS — K5730 Diverticulosis of large intestine without perforation or abscess without bleeding: Secondary | ICD-10-CM | POA: Diagnosis not present

## 2021-05-16 DIAGNOSIS — D125 Benign neoplasm of sigmoid colon: Secondary | ICD-10-CM | POA: Diagnosis not present

## 2021-06-09 ENCOUNTER — Encounter: Payer: Self-pay | Admitting: Hematology and Oncology

## 2021-06-10 ENCOUNTER — Other Ambulatory Visit: Payer: Self-pay

## 2021-06-10 MED ORDER — RIVAROXABAN 20 MG PO TABS: 20.0000 mg | ORAL_TABLET | Freq: Every day | ORAL | 0 refills | Status: DC

## 2021-06-12 ENCOUNTER — Other Ambulatory Visit: Payer: Self-pay | Admitting: *Deleted

## 2021-06-12 MED ORDER — RIVAROXABAN 20 MG PO TABS: 20.0000 mg | ORAL_TABLET | Freq: Every day | ORAL | 0 refills | Status: DC

## 2021-06-13 ENCOUNTER — Encounter: Payer: Self-pay | Admitting: Hematology and Oncology

## 2021-07-02 NOTE — Progress Notes (Signed)
°  HEMATOLOGY-ONCOLOGY MYCHART VIDEO VISIT PROGRESS NOTE  I connected with Albert [name] on 07/04/2021 at 11:30 AM EST by MyChart video conference and verified that I am speaking with the correct person using two identifiers.  I discussed the limitations, risks, security and privacy concerns of performing an evaluation and management service by MyChart and the availability of in person appointments.  I also discussed with the patient that there may be a patient responsible charge related to this service. The patient expressed understanding and agreed to proceed.  Patient's Location: Home Physician Location: Clinic  CHIEF COMPLIANT: Follow-up of DVT and PE on lifelong anticoagulation   INTERVAL HISTORY: Albert [name] is a 71 y.o. male with above-mentioned history of DVT and PE on lifelong anticoagulation with Xarelto. He presents via [place] today for follow-up. No bleeding.  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 He does not smoke cigarettes and stays very active and he is not overweight.  There is no family history of blood clots either. Patient has retired from [employer].   Patient moved from [place] to [place] 05/22/2019.  This is to be closer to their grandchildren.   Current treatment: Coumadin switched to Xarelto 07/03/2019 (lupus anticoagulant negative)     Xarelto toxicities: None He is able to afford Xarelto.  When he goes in the donut hole he applies for manufacturer assistance and he is getting it.    Return to clinic in 1 year for follow-up with a MyChart virtual visit    I discussed the assessment and treatment plan with the patient. The patient was provided an opportunity to ask questions and all were answered. The patient agreed with the plan and demonstrated an understanding of the instructions. The patient was advised to call back or seek an in-person evaluation if the symptoms worsen or if the condition fails to improve as anticipated.   Total time spent: 12 minutes including face-to-face MyChart video visit time and time spent for planning, charting and coordination of care  Rulon Eisenmenger, MD 07/04/2021  I, Thana Ates am acting as scribe for Nicholas Lose, MD.  I have reviewed the above documentation for accuracy and completeness, and I agree with the above.

## 2021-07-04 ENCOUNTER — Inpatient Hospital Stay: Payer: PPO | Attending: Hematology and Oncology | Admitting: Hematology and Oncology

## 2021-07-04 DIAGNOSIS — I82409 Acute embolism and thrombosis of unspecified deep veins of unspecified lower extremity: Secondary | ICD-10-CM

## 2021-07-04 MED ORDER — RIVAROXABAN 20 MG PO TABS: 20.0000 mg | ORAL_TABLET | Freq: Every day | ORAL | 3 refills | Status: DC

## 2021-07-04 NOTE — Assessment & Plan Note (Signed)
Recurrent DVT and PE: First episode after back surgery and 2005 (DVT and PE) treated with Coumadin for 1 year Second episode 2007 DVT right leg: To Coumadin for 1 year Third episode 2014: DVT and PE: Now remains on indefinite Coumadin  In [place] he had extensive blood work for inherited and acquired risk factors for blood clots and he was told that there was no such risk. He does not smoke cigarettes and stays very active and he is not overweight.There is no family history of blood clots either. Patient has retired from [employer].  Patient moved from [place] to [place] 05/22/2019. This is to be closer to their grandchildren.  Current treatment: Coumadin switched to Xarelto 07/03/2019 (lupus anticoagulant negative)   Xarelto toxicities: None He is able to afford Xarelto.  When he goes in the donut hole he applies for manufacturer assistance and he is getting it.  Return to clinic in 1 year for follow-up with a MyChart virtual visit

## 2021-07-08 ENCOUNTER — Telehealth: Payer: Self-pay | Admitting: Hematology and Oncology

## 2021-07-08 NOTE — Telephone Encounter (Signed)
Left message with follow-up appointment per 12/7 los. 

## 2021-07-12 ENCOUNTER — Other Ambulatory Visit: Payer: Self-pay | Admitting: Cardiology

## 2021-07-14 ENCOUNTER — Other Ambulatory Visit: Payer: Self-pay

## 2021-07-14 ENCOUNTER — Ambulatory Visit: Payer: PPO | Admitting: Cardiology

## 2021-07-14 ENCOUNTER — Encounter: Payer: Self-pay | Admitting: Cardiology

## 2021-07-14 DIAGNOSIS — I493 Ventricular premature depolarization: Secondary | ICD-10-CM | POA: Diagnosis not present

## 2021-07-14 DIAGNOSIS — I341 Nonrheumatic mitral (valve) prolapse: Secondary | ICD-10-CM | POA: Diagnosis not present

## 2021-07-14 DIAGNOSIS — I82409 Acute embolism and thrombosis of unspecified deep veins of unspecified lower extremity: Secondary | ICD-10-CM

## 2021-07-14 DIAGNOSIS — E785 Hyperlipidemia, unspecified: Secondary | ICD-10-CM

## 2021-07-14 DIAGNOSIS — I340 Nonrheumatic mitral (valve) insufficiency: Secondary | ICD-10-CM | POA: Diagnosis not present

## 2021-07-14 DIAGNOSIS — I10 Essential (primary) hypertension: Secondary | ICD-10-CM | POA: Diagnosis not present

## 2021-07-14 NOTE — Progress Notes (Unsigned)
Primary Care Provider: Antony Contras, MD Cardiologist: None Electrophysiologist: None  Clinic Note: No chief complaint on file.   ===================================  ASSESSMENT/PLAN   Problem List Items Addressed This Visit   None   ===================================  HPI:    Albert [name] is a 71 y.o. male with a PMH notable for MVP (mild MR), frequent PVCs, HTN and HLD with recurrent DVT who presents today for 34-month follow-up.  Albert [name] was last seen in August 2022 after echocardiogram.  PVCs controlled with Toprol.  Avoiding triggers.  BP relatively well controlled at home. Plan was to recheck Echocardiogram in 2025. Plan to follow-up lipids.  Recent Hospitalizations: none  Reviewed  CV studies:    The following studies were reviewed today: (if available, images/films reviewed: From Epic Chart or Care Everywhere) ***:  Interval History:   Albert [name]   CV Review of Symptoms (Summary) Cardiovascular ROS: {roscv:310661}  REVIEWED OF SYSTEMS   ROS  I have reviewed and (if needed) personally updated the patient's problem list, medications, allergies, past medical and surgical history, social and family history.   PAST MEDICAL HISTORY   Past Medical History:  Diagnosis Date   Acute deep vein thrombosis of lower limb ([place]) 2005   Initial episode in 2005 with DVT and PE following low back surgery.  [Reportedly negative evaluation from second episode right leg DVT 2007 (1/2 to 1-year); third episode in 2016 now on lifetime anticoagulation.  (Does home INR checks warfarin).   Chronic lower back pain    Frequent PVCs    With occasional cardiac, controlled on Toprol 25 mg   GERD (gastroesophageal reflux disease)    History of anticoagulant therapy    Hypercholesterolemia    Not currently on treatment   Mitral valve prolapse    By historical data.  Not confirmed in 2017.   Pulmonary embolism ([place])    History of multiple DVTs with PE, on  chronic anticoagulation (currently on warfarin with potential conversion to Xarelto)   Tinnitus     PAST SURGICAL HISTORY   Past Surgical History:  Procedure Laterality Date   COLONOSCOPY  2007   LUMBAR SPINE SURGERY  2006   TONSILLECTOMY     TRANSTHORACIC ECHOCARDIOGRAM  06/2015   LVEF 60 to 65%.  No RWMA.  GR 1 DD.  Mild MR (no mention of MVP).  Normal left atrial size.  Otherwise normal valves chamber sizes.   TRANSTHORACIC ECHOCARDIOGRAM  07/2020   EF 60 to 65%.  No or WMA.  Normal RV.  Mild to moderate MR with mild late systolic prolapse of the middle scallop of the posterior leaflet.  Aortic valve sclerosis but no stenosis.  Normal RAP.     There is no immunization history on file for this patient.  MEDICATIONS/ALLERGIES   Current Meds  Medication Sig   diphenhydramine-acetaminophen (TYLENOL PM EXTRA STRENGTH) 25-500 MG TABS tablet    Famotidine-Ca Carb-Mag Hydrox (PEPCID COMPLETE PO)    LORazepam (ATIVAN) 2 MG tablet Take 1 tablet (2 mg total) by mouth at bedtime as needed for anxiety.   metoprolol succinate (TOPROL-XL) 50 MG 24 hr tablet TAKE 1 TABLET BY MOUTH DAILY. TAKE WITH OR IMMEDIATELY FOLLOWING A MEAL.   rivaroxaban (XARELTO) 20 MG TABS tablet Take 1 tablet (20 mg total) by mouth daily with supper.    Allergies  Allergen Reactions   Aspirin    Nsaids    Other Other (See Comments)    Do not give due to  chronic anticoagulation on coumadin.   Penicillins     SOCIAL HISTORY/FAMILY HISTORY   Reviewed in Epic:  Pertinent findings:  Social History   Tobacco Use   Smoking status: Never   Smokeless tobacco: Never  Substance Use Topics   Alcohol use: No   Drug use: No   Social History   Social History Narrative   He has wife recently moved to [place] from the [place] area in [place].  She is a native of [place] while he is from [place].      Their children and grandchildren are here in [place].      They both enjoy walking and  hiking in the [place].    OBJCTIVE -PE, EKG, labs   Wt Readings from Last 3 Encounters:  07/14/21 195 lb 9.6 oz (88.7 kg)  01/30/21 196 lb 6.4 oz (89.1 kg)  08/01/20 197 lb 9.6 oz (89.6 kg)    Physical Exam: BP 140/77    Pulse 81    Ht 5\' 10"  (1.778 m)    Wt 195 lb 9.6 oz (88.7 kg)    SpO2 95%    BMI 28.07 kg/m  Physical Exam   Adult ECG Report  Rate: *** ;  Rhythm: {rhythm:17366};   Narrative Interpretation: ***  Recent Labs:  ***  No results found for: CHOL, HDL, LDLCALC, LDLDIRECT, TRIG, CHOLHDL No results found for: CREATININE, BUN, NA, K, CL, CO2 No flowsheet data found.  No results found for: HGBA1C No results found for: TSH  ==================================================  COVID-19 Education: The signs and symptoms of COVID-19 were discussed with the patient and how to seek care for testing (follow up with PCP or arrange E-visit).    I spent a total of ***minutes with the patient spent in direct patient consultation.  Additional time spent with chart review  / charting (studies, outside notes, etc): *** min Total Time: *** min  Current medicines are reviewed at length with the patient today.  (+/- concerns) ***  This visit occurred during the SARS-CoV-2 public health emergency.  Safety protocols were in place, including screening questions prior to the visit, additional usage of staff PPE, and extensive cleaning of exam room while observing appropriate contact time as indicated for disinfecting solutions.  Notice: This dictation was prepared with Dragon dictation along with smart phrase technology. Any transcriptional errors that result from this process are unintentional and may not be corrected upon review.  Studies Ordered:   No orders of the defined types were placed in this encounter.   Patient Instructions / Medication Changes & Studies & Tests Ordered   There are no Patient Instructions on file for this visit.     Glenetta Hew, M.D., M.S. Interventional Cardiologist   Pager # [phone] Phone # [phone] [address] [place], [place] [address]   Thank you for choosing Heartcare at [facility]!!

## 2021-07-14 NOTE — Patient Instructions (Addendum)
Medication Instructions:   No changes  *If you need a refill on your cardiac medications before your next appointment, please call your pharmacy*   Lab Work: Not needed    Testing/Procedures: Not needed   Follow-Up: At [facility], you and your health needs are our priority.  As part of our continuing mission to provide you with exceptional heart care, we have created designated Provider Care Teams.  These Care Teams include your primary Cardiologist (physician) and Advanced Practice Providers (APPs -  Physician Assistants and Nurse Practitioners) who all work together to provide you with the care you need, when you need it.     Your next appointment:   12 month(s)  The format for your next appointment:   In Person  Provider:   None

## 2021-07-22 DIAGNOSIS — Z125 Encounter for screening for malignant neoplasm of prostate: Secondary | ICD-10-CM | POA: Diagnosis not present

## 2021-07-22 DIAGNOSIS — D6869 Other thrombophilia: Secondary | ICD-10-CM | POA: Diagnosis not present

## 2021-07-22 DIAGNOSIS — I493 Ventricular premature depolarization: Secondary | ICD-10-CM | POA: Diagnosis not present

## 2021-07-22 DIAGNOSIS — I10 Essential (primary) hypertension: Secondary | ICD-10-CM | POA: Diagnosis not present

## 2021-07-22 DIAGNOSIS — R1013 Epigastric pain: Secondary | ICD-10-CM | POA: Diagnosis not present

## 2021-07-22 DIAGNOSIS — I341 Nonrheumatic mitral (valve) prolapse: Secondary | ICD-10-CM | POA: Diagnosis not present

## 2021-07-22 DIAGNOSIS — E7800 Pure hypercholesterolemia, unspecified: Secondary | ICD-10-CM | POA: Diagnosis not present

## 2021-07-22 DIAGNOSIS — Z0000 Encounter for general adult medical examination without abnormal findings: Secondary | ICD-10-CM | POA: Diagnosis not present

## 2021-07-22 DIAGNOSIS — G4700 Insomnia, unspecified: Secondary | ICD-10-CM | POA: Diagnosis not present

## 2021-07-22 DIAGNOSIS — Z23 Encounter for immunization: Secondary | ICD-10-CM | POA: Diagnosis not present

## 2021-07-22 DIAGNOSIS — Z1389 Encounter for screening for other disorder: Secondary | ICD-10-CM | POA: Diagnosis not present

## 2021-07-22 DIAGNOSIS — Z86711 Personal history of pulmonary embolism: Secondary | ICD-10-CM | POA: Diagnosis not present

## 2021-08-29 ENCOUNTER — Encounter: Payer: Self-pay | Admitting: Hematology and Oncology

## 2021-08-30 ENCOUNTER — Encounter: Payer: Self-pay | Admitting: Cardiology

## 2021-08-30 NOTE — Assessment & Plan Note (Addendum)
Stable findings on exam.  No significant symptoms.  Palpitations are pretty well controlled.  Plan is to follow-up echocardiogram in 2024-2025. ?

## 2021-08-30 NOTE — Assessment & Plan Note (Signed)
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  No bleeding issues.-Monitored by Hematology/Oncology ?

## 2021-08-30 NOTE — Assessment & Plan Note (Signed)
His blood pressure seems to be better controlled at home.  Of asked that he monitor and log his pressures in order to discuss them when he sees his PCP. ?

## 2021-08-30 NOTE — Assessment & Plan Note (Signed)
Has not had lipids checked since last visit.  Target would be less than 100.  Should be due for follow-up check with PCP.  Depending on those labs, potentially consider initiating statin. ?

## 2021-08-30 NOTE — Assessment & Plan Note (Addendum)
Symptoms seem pretty well controlled with current dose of Toprol. ? ?Minimal breakthrough episodes requiring additional as needed Lopressor. ? ?Continue current dose of Toprol..  Provide refills when necessary ?

## 2021-11-27 ENCOUNTER — Ambulatory Visit
Admission: RE | Admit: 2021-11-27 | Discharge: 2021-11-27 | Disposition: A | Discharge status: Home / Self Care | Payer: PPO | Source: Ambulatory Visit | Attending: Family Medicine | Admitting: Family Medicine

## 2021-11-27 ENCOUNTER — Other Ambulatory Visit: Payer: Self-pay | Admitting: Family Medicine

## 2021-11-27 DIAGNOSIS — R1031 Right lower quadrant pain: Secondary | ICD-10-CM | POA: Diagnosis not present

## 2021-11-27 DIAGNOSIS — K219 Gastro-esophageal reflux disease without esophagitis: Secondary | ICD-10-CM | POA: Diagnosis not present

## 2021-11-27 DIAGNOSIS — Z8719 Personal history of other diseases of the digestive system: Secondary | ICD-10-CM | POA: Diagnosis not present

## 2021-12-12 ENCOUNTER — Other Ambulatory Visit: Payer: Self-pay | Admitting: Physician Assistant

## 2021-12-12 DIAGNOSIS — R1031 Right lower quadrant pain: Secondary | ICD-10-CM | POA: Diagnosis not present

## 2021-12-15 ENCOUNTER — Other Ambulatory Visit: Payer: Self-pay | Admitting: Physician Assistant

## 2021-12-15 DIAGNOSIS — R1031 Right lower quadrant pain: Secondary | ICD-10-CM

## 2021-12-17 ENCOUNTER — Ambulatory Visit
Admission: RE | Admit: 2021-12-17 | Discharge: 2021-12-17 | Disposition: A | Discharge status: Home / Self Care | Payer: PPO | Source: Ambulatory Visit | Attending: Physician Assistant | Admitting: Physician Assistant

## 2021-12-17 DIAGNOSIS — K37 Unspecified appendicitis: Secondary | ICD-10-CM | POA: Diagnosis not present

## 2021-12-17 DIAGNOSIS — K5730 Diverticulosis of large intestine without perforation or abscess without bleeding: Secondary | ICD-10-CM | POA: Diagnosis not present

## 2021-12-17 DIAGNOSIS — K4090 Unilateral inguinal hernia, without obstruction or gangrene, not specified as recurrent: Secondary | ICD-10-CM | POA: Diagnosis not present

## 2021-12-17 DIAGNOSIS — I700 Atherosclerosis of aorta: Secondary | ICD-10-CM | POA: Diagnosis not present

## 2021-12-17 DIAGNOSIS — R1031 Right lower quadrant pain: Secondary | ICD-10-CM

## 2021-12-17 MED ORDER — IOPAMIDOL (ISOVUE-300) INJECTION 61%
100.0000 mL | Freq: Once | INTRAVENOUS | Status: AC | PRN
Start: 2021-12-17 — End: 2021-12-17
  Administered 2021-12-17: 100 mL via INTRAVENOUS

## 2021-12-29 ENCOUNTER — Encounter: Payer: Self-pay | Admitting: Hematology and Oncology

## 2022-01-02 ENCOUNTER — Telehealth: Payer: Self-pay

## 2022-01-02 ENCOUNTER — Ambulatory Visit: Payer: Self-pay | Admitting: Surgery

## 2022-01-02 DIAGNOSIS — K429 Umbilical hernia without obstruction or gangrene: Secondary | ICD-10-CM | POA: Diagnosis not present

## 2022-01-02 DIAGNOSIS — K4090 Unilateral inguinal hernia, without obstruction or gangrene, not specified as recurrent: Secondary | ICD-10-CM | POA: Diagnosis not present

## 2022-01-02 DIAGNOSIS — K36 Other appendicitis: Secondary | ICD-10-CM | POA: Diagnosis not present

## 2022-01-02 NOTE — H&P (View-Only) (Signed)
Subjective   Chief Complaint: appendix   Hem/Onc- [facility] - Ellyn Hack   History of Present Illness: Albert [name] is a 71 y.o. male who is seen today as an office consultation at the request of Dr. Alyson Ingles for evaluation of appendix .    This is a 71 year old male who is on chronic anticoagulation for recurrent pulmonary emboli who presents with a recent history of several weeks of persistent right lower quadrant abdominal pain.  Initially this was felt to represent a muscle strain.  The patient is in the process of moving to a new home.  However, with the persistence of the pain, he underwent CT scan of the abdomen and pelvis.  This shows some thickening of the distal appendix with no sign of inflammation.  The patient states that he had a colonoscopy in November 2022 that showed some diverticulosis but no masses at the appendiceal orifice.  We will try to obtain those records from [place].  Incidentally, the patient was noted to have a left inguinal hernia but he is completely asymptomatic.  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   Review of Systems: A complete review of systems was obtained from the patient.  I have reviewed this information and discussed as appropriate with the patient.  See HPI as well for other ROS.  Review of Systems  Constitutional: Negative.   HENT: Negative.    Eyes: Negative.   Respiratory: Negative.    Cardiovascular: Negative.   Gastrointestinal: Negative.   Genitourinary: Negative.   Musculoskeletal: Negative.   Skin: Negative.   Neurological: Negative.   Endo/Heme/Allergies:  Bruises/bleeds easily.  Psychiatric/Behavioral:  The patient is nervous/anxious.      Medical History: Past Medical History:  Diagnosis Date   Asthma without status asthmaticus    Childhood only   Chronic anticoagulation    Due to DVT/PE.  Does hm INR monitoring through [employer].  INR goal 2-3.   Chronic low back pain    Chronic shoulder pain    DVT (deep venous  thrombosis) (CMS-HCC)    x3 leading to PE's.  On chronic anticoagulation w/ coumadin.  Negative hematology wrkp in past.   Frequent PVCs    Which cause a lot of sx, well controlled on chronic metoprolol.   GERD (gastroesophageal reflux disease)    intermittent   History of pulmonary embolus (PE)    x3 2/2 mobilization of DVT's   IGT (impaired glucose tolerance)    Mixed hyperlipidemia    MVP (mitral valve prolapse)    Followed by echo Q31yr  Seasonal allergic rhinitis    Well controlled w/ zyrtec   Skin cancer    Tinnitus     Patient Active Problem List  Diagnosis   Chronic low back pain   IGT (impaired glucose tolerance)   Chronic anticoagulation   Mixed hyperlipidemia   Chronic shoulder pain   DVT (deep venous thrombosis) (CMS-HCC)   Frequent PVCs   Seasonal allergic rhinitis    Past Surgical History:  Procedure Laterality Date   TONSILLECTOMY  1957   ARTHRODESIS POSTERIOR LUMBAR SPINE W/LAMINECTOMY/DISCECTOMY  11/2002   L4-5   COLONOSCOPY  2007   Negative   Bone Spur       Allergies  Allergen Reactions   Cat Dander Other (See Comments)   Nsaids (Non-Steroidal Anti-Inflammatory Drug) Other (See Comments)    Do not give due to chronic anticoagulation on coumadin.   Other Other (See Comments)    Do not give due  to chronic anticoagulation on coumadin.   Pantoprazole Other (See Comments)   Penicillins Rash   Pollen Extracts Other (See Comments)    Current Outpatient Medications on File Prior to Visit  Medication Sig Dispense Refill   cetirizine (ZYRTEC) 10 MG tablet      LORazepam (ATIVAN) 0.5 MG tablet Take 1 tablet (0.5 mg total) by mouth nightly as needed for Sleep 30 tablet 0   metoprolol succinate (TOPROL-XL) 25 MG XL tablet Take 1 tablet (25 mg total) by mouth once daily 90 tablet 1   pantoprazole (PROTONIX) 40 MG DR tablet TK 1 T PO D BEFORE A MEAL  0   warfarin (COUMADIN) 5 MG tablet 7.5 mg M-W-F, 5 mg T-Th-Sa-Su 110 tablet 1   No current  facility-administered medications on file prior to visit.    Family History  Problem Relation Age of Onset   Lung cancer Mother    Lung cancer Father    No Known Problems Sister    Alcohol abuse Brother    No Known Problems Daughter    No Known Problems Son    No Known Problems Son      Social History   Tobacco Use  Smoking Status Never  Smokeless Tobacco Never     Social History   Socioeconomic History   Marital status: Married    Spouse name: Dianne   Number of children: 3  Occupational History   Occupation: Engineer, site    Comment: Retired  Tobacco Use   Smoking status: Never   Smokeless tobacco: Never  Substance and Sexual Activity   Alcohol use: Not Currently   Drug use: Never   Sexual activity: Not Currently    Partners: Female  Social History Narrative   Married to second wife, Draiden Mirsky, who is also our pt.    Objective:    Vitals:   01/02/22 1056  BP: 118/86  Pulse: 97  Temp: 36.9 C (98.5 F)  SpO2: 99%  Weight: 85.5 kg (188 lb 9.6 oz)  Height: 177.8 cm ('5\' 10"'$ )    Body mass index is 27.06 kg/m.  Physical Exam   Constitutional:  WDWN in NAD, conversant, no obvious deformities; lying in bed comfortably Eyes:  Pupils equal, round; sclera anicteric; moist conjunctiva; no lid lag HENT:  Oral mucosa moist; good dentition  Neck:  No masses palpated, trachea midline; no thyromegaly Lungs:  CTA bilaterally; normal respiratory effort CV:  Regular rate and rhythm; no murmurs; extremities well-perfused with no edema Abd:  +bowel sounds, soft, visible umbilical hernia that is mostly reducible, minimal right lower quadrant tenderness GU: Bilateral descended testes, no testicular masses, no palpable hernia on either side Musc:  Normal gait; no apparent clubbing or cyanosis in extremities Lymphatic:  No palpable cervical or axillary lymphadenopathy Skin:  Warm, dry; no sign of jaundice Psychiatric - alert and oriented x 4; calm  mood and affect   Labs, Imaging and Diagnostic Testing: CLINICAL DATA:  Right lower quadrant abdominal pain for 4 weeks.   EXAM: CT ABDOMEN AND PELVIS WITH CONTRAST   TECHNIQUE: Multidetector CT imaging of the abdomen and pelvis was performed using the standard protocol following bolus administration of intravenous contrast.   RADIATION DOSE REDUCTION: This exam was performed according to the departmental dose-optimization program which includes automated exposure control, adjustment of the mA and/or kV according to patient size and/or use of iterative reconstruction technique.   CONTRAST:  156m ISOVUE-300 IOPAMIDOL (ISOVUE-300) INJECTION 61%   COMPARISON:  None Available.  FINDINGS: Lower chest: The lung bases are clear of acute process. No pleural effusion or pulmonary lesions. The heart is normal in size. No pericardial effusion. The distal esophagus and aorta are unremarkable.   Hepatobiliary: No hepatic lesions or intrahepatic biliary dilatation. The gallbladder is unremarkable. No common bile duct dilatation.   Pancreas: No mass, inflammation or ductal dilatation.   Spleen: Normal size.  No focal lesions.   Adrenals/Urinary Tract: The adrenal glands are normal.   The left kidney is non rotated and located just above the left iliac crest. No renal lesions or hydronephrosis. No renal calculi. No findings for pyelonephritis. The bladder is unremarkable.   Stomach/Bowel: The stomach, duodenum, small bowel and colon are unremarkable. No acute inflammatory process, mass lesions or obstructive findings. There is diffuse colonic diverticulosis but no findings for acute diverticulitis. The mid distal portion of the appendix is thickened and contains some fluid and gas. Maximum diameter is 11 mm. The more proximal appendix appears normal. I do not see any definite appendicoliths but I would be worried about early tip appendicitis. Recommend correlation with clinical  findings and recommend surgical consultation. The terminal ileum is normal.   Vascular/Lymphatic: Minimal scattered atherosclerotic calcifications involving the aorta and iliac arteries but no aneurysm. No mesenteric or retroperitoneal mass or adenopathy. The major venous structures are patent.   Reproductive: The prostate gland and seminal vesicles are unremarkable.   Other: Left inguinal hernia containing fat. No free abdominal or free pelvic fluid.   Musculoskeletal: No significant bony findings. Lumbar fusion hardware noted at L4-5.   IMPRESSION: 1. Equivocal CT findings involving the appendix. Could not exclude early tip appendicitis as detailed above. Recommend correlation with clinical findings and recommend surgical consultation. 2. Diffuse colonic diverticulosis without findings for acute diverticulitis. 3. Left inguinal hernia containing fat.   These results will be called to the ordering clinician or representative by the Radiologist Assistant, and communication documented in the PACS or [employer].     Electronically Signed   By: Marijo Sanes M.D.   On: 12/17/2021 15:08    Assessment and Plan:  Diagnoses and all orders for this visit:  Chronic appendicitis  Umbilical hernia without obstruction or gangrene  Non-recurrent unilateral inguinal hernia without obstruction or gangrene     The persistent right lower quadrant discomfort although improving, is mildly concerning.  I am also concerned about the appearance of the thickened enlarged tip of the appendix.  We must rule out appendiceal neoplasm.  He has umbilical hernia is minimally symptomatic but can be repaired at the time of the laparoscopic appendectomy.  We will not use mesh for that hernia repair.  His left inguinal hernia is asymptomatic and not palpable on physical examination.  Would not recommend repair at this time since we would not be able to use mesh due to the laparoscopic  appendectomy.  We will confer with Dr. Lindi Adie regarding the anticoagulation.  He may require Lovenox bridge.  We will obtain cardiac clearance from Dr. Ellyn Hack before scheduling surgery.  Carlean Jews, MD  01/02/2022 12:18 PM

## 2022-01-02 NOTE — H&P (Signed)
Subjective   Chief Complaint: appendix   Hem/Onc- [name] - Albert [name]   History of Present Illness: Albert [name] is a 71 y.o. male who is seen today as an office consultation at the request of Dr. Alyson [name] for evaluation of appendix .    This is a 71 year old male who is on chronic anticoagulation for recurrent pulmonary emboli who presents with a recent history of several weeks of persistent right lower quadrant abdominal pain.  Initially this was felt to represent a muscle strain.  The patient is in the process of moving to a new home.  However, with the persistence of the pain, he underwent CT scan of the abdomen and pelvis.  This shows some thickening of the distal appendix with no sign of inflammation.  The patient states that he had a colonoscopy in November 2022 that showed some diverticulosis but no masses at the appendiceal orifice.  We will try to obtain those records from [place].  Incidentally, the patient was noted to have a left inguinal hernia but he is completely asymptomatic.  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   Review of Systems: A complete review of systems was obtained from the patient.  I have reviewed this information and discussed as appropriate with the patient.  See HPI as well for other ROS.  Review of Systems  Constitutional: Negative.   HENT: Negative.    Eyes: Negative.   Respiratory: Negative.    Cardiovascular: Negative.   Gastrointestinal: Negative.   Genitourinary: Negative.   Musculoskeletal: Negative.   Skin: Negative.   Neurological: Negative.   Endo/Heme/Allergies:  Bruises/bleeds easily.  Psychiatric/Behavioral:  The patient is nervous/anxious.      Medical History: Past Medical History:  Diagnosis Date   Asthma without status asthmaticus    Childhood only   Chronic anticoagulation    Due to DVT/PE.  Does hm INR monitoring through [facility].  INR goal 2-3.   Chronic low back pain    Chronic shoulder pain    DVT (deep venous  thrombosis) (CMS-HCC)    x3 leading to PE's.  On chronic anticoagulation w/ coumadin.  Negative hematology wrkp in past.   Frequent PVCs    Which cause a lot of sx, well controlled on chronic metoprolol.   GERD (gastroesophageal reflux disease)    intermittent   History of pulmonary embolus (PE)    x3 2/2 mobilization of DVT's   IGT (impaired glucose tolerance)    Mixed hyperlipidemia    MVP (mitral valve prolapse)    Followed by echo Q23yr  Seasonal allergic rhinitis    Well controlled w/ zyrtec   Skin cancer    Tinnitus     Patient Active Problem List  Diagnosis   Chronic low back pain   IGT (impaired glucose tolerance)   Chronic anticoagulation   Mixed hyperlipidemia   Chronic shoulder pain   DVT (deep venous thrombosis) (CMS-HCC)   Frequent PVCs   Seasonal allergic rhinitis    Past Surgical History:  Procedure Laterality Date   TONSILLECTOMY  1957   ARTHRODESIS POSTERIOR LUMBAR SPINE W/LAMINECTOMY/DISCECTOMY  11/2002   L4-5   COLONOSCOPY  2007   Negative   Bone Spur       Allergies  Allergen Reactions   Cat Dander Other (See Comments)   Nsaids (Non-Steroidal Anti-Inflammatory Drug) Other (See Comments)    Do not give due to chronic anticoagulation on coumadin.   Other Other (See Comments)    Do not give due  to chronic anticoagulation on coumadin.   Pantoprazole Other (See Comments)   Penicillins Rash   Pollen Extracts Other (See Comments)    Current Outpatient Medications on File Prior to Visit  Medication Sig Dispense Refill   cetirizine (ZYRTEC) 10 MG tablet      LORazepam (ATIVAN) 0.5 MG tablet Take 1 tablet (0.5 mg total) by mouth nightly as needed for Sleep 30 tablet 0   metoprolol succinate (TOPROL-XL) 25 MG XL tablet Take 1 tablet (25 mg total) by mouth once daily 90 tablet 1   pantoprazole (PROTONIX) 40 MG DR tablet TK 1 T PO D BEFORE A MEAL  0   warfarin (COUMADIN) 5 MG tablet 7.5 mg M-W-F, 5 mg T-Th-Sa-Su 110 tablet 1   No current  facility-administered medications on file prior to visit.    Family History  Problem Relation Age of Onset   Lung cancer Mother    Lung cancer Father    No Known Problems Sister    Alcohol abuse Brother    No Known Problems Daughter    No Known Problems Son    No Known Problems Son      Social History   Tobacco Use  Smoking Status Never  Smokeless Tobacco Never     Social History   Socioeconomic History   Marital status: Married    Spouse name: Albert [name]   Number of children: 3  Occupational History   Occupation: Engineer, site    Comment: Retired  Tobacco Use   Smoking status: Never   Smokeless tobacco: Never  Substance and Sexual Activity   Alcohol use: Not Currently   Drug use: Never   Sexual activity: Not Currently    Partners: Female  Social History Narrative   Married to second wife, Albert [name], who is also our pt.    Objective:    Vitals:   01/02/22 1056  BP: 118/86  Pulse: 97  Temp: 36.9 C (98.5 F)  SpO2: 99%  Weight: 85.5 kg (188 lb 9.6 oz)  Height: 177.8 cm ('5\' 10"'$ )    Body mass index is 27.06 kg/m.  Physical Exam   Constitutional:  WDWN in NAD, conversant, no obvious deformities; lying in bed comfortably Eyes:  Pupils equal, round; sclera anicteric; moist conjunctiva; no lid lag HENT:  Oral mucosa moist; good dentition  Neck:  No masses palpated, trachea midline; no thyromegaly Lungs:  CTA bilaterally; normal respiratory effort CV:  Regular rate and rhythm; no murmurs; extremities well-perfused with no edema Abd:  +bowel sounds, soft, visible umbilical hernia that is mostly reducible, minimal right lower quadrant tenderness GU: Bilateral descended testes, no testicular masses, no palpable hernia on either side Musc:  Normal gait; no apparent clubbing or cyanosis in extremities Lymphatic:  No palpable cervical or axillary lymphadenopathy Skin:  Warm, dry; no sign of jaundice Psychiatric - alert and oriented x 4; calm  mood and affect   Labs, Imaging and Diagnostic Testing: CLINICAL DATA:  Right lower quadrant abdominal pain for 4 weeks.   EXAM: CT ABDOMEN AND PELVIS WITH CONTRAST   TECHNIQUE: Multidetector CT imaging of the abdomen and pelvis was performed using the standard protocol following bolus administration of intravenous contrast.   RADIATION DOSE REDUCTION: This exam was performed according to the departmental dose-optimization program which includes automated exposure control, adjustment of the mA and/or kV according to patient size and/or use of iterative reconstruction technique.   CONTRAST:  154m ISOVUE-300 IOPAMIDOL (ISOVUE-300) INJECTION 61%   COMPARISON:  None Available.  FINDINGS: Lower chest: The lung bases are clear of acute process. No pleural effusion or pulmonary lesions. The heart is normal in size. No pericardial effusion. The distal esophagus and aorta are unremarkable.   Hepatobiliary: No hepatic lesions or intrahepatic biliary dilatation. The gallbladder is unremarkable. No common bile duct dilatation.   Pancreas: No mass, inflammation or ductal dilatation.   Spleen: Normal size.  No focal lesions.   Adrenals/Urinary Tract: The adrenal glands are normal.   The left kidney is non rotated and located just above the left iliac crest. No renal lesions or hydronephrosis. No renal calculi. No findings for pyelonephritis. The bladder is unremarkable.   Stomach/Bowel: The stomach, duodenum, small bowel and colon are unremarkable. No acute inflammatory process, mass lesions or obstructive findings. There is diffuse colonic diverticulosis but no findings for acute diverticulitis. The mid distal portion of the appendix is thickened and contains some fluid and gas. Maximum diameter is 11 mm. The more proximal appendix appears normal. I do not see any definite appendicoliths but I would be worried about early tip appendicitis. Recommend correlation with clinical  findings and recommend surgical consultation. The terminal ileum is normal.   Vascular/Lymphatic: Minimal scattered atherosclerotic calcifications involving the aorta and iliac arteries but no aneurysm. No mesenteric or retroperitoneal mass or adenopathy. The major venous structures are patent.   Reproductive: The prostate gland and seminal vesicles are unremarkable.   Other: Left inguinal hernia containing fat. No free abdominal or free pelvic fluid.   Musculoskeletal: No significant bony findings. Lumbar fusion hardware noted at L4-5.   IMPRESSION: 1. Equivocal CT findings involving the appendix. Could not exclude early tip appendicitis as detailed above. Recommend correlation with clinical findings and recommend surgical consultation. 2. Diffuse colonic diverticulosis without findings for acute diverticulitis. 3. Left inguinal hernia containing fat.   These results will be called to the ordering clinician or representative by the Radiologist Assistant, and communication documented in the PACS or [employer].     Electronically Signed   By: Marijo Sanes M.D.   On: 12/17/2021 15:08    Assessment and Plan:  Diagnoses and all orders for this visit:  Chronic appendicitis  Umbilical hernia without obstruction or gangrene  Non-recurrent unilateral inguinal hernia without obstruction or gangrene     The persistent right lower quadrant discomfort although improving, is mildly concerning.  I am also concerned about the appearance of the thickened enlarged tip of the appendix.  We must rule out appendiceal neoplasm.  He has umbilical hernia is minimally symptomatic but can be repaired at the time of the laparoscopic appendectomy.  We will not use mesh for that hernia repair.  His left inguinal hernia is asymptomatic and not palpable on physical examination.  Would not recommend repair at this time since we would not be able to use mesh due to the laparoscopic  appendectomy.  We will confer with Dr. Lindi Adie regarding the anticoagulation.  He may require Lovenox bridge.  We will obtain cardiac clearance from Dr. Ellyn [name] before scheduling surgery.  Carlean Jews, MD  01/02/2022 12:18 PM

## 2022-01-02 NOTE — Telephone Encounter (Signed)
   Pre-operative Risk Assessment    Patient Name: Albert [name]  DOB: 1950/07/16 MRN: [id number]      Request for Surgical Clearance    Procedure:   Hernia Surgery  Date of Surgery:  Clearance TBD                                 Surgeon:  Donnie Mesa, MD Surgeon's Group or Practice Name:  [facility] Phone number:  [phone] Fax number:  [phone] Mammie Lorenzo   Type of Clearance Requested:   - Medical    Type of Anesthesia:  General    Additional requests/questions:  Please advise surgeon/provider what medications should be held.  Signed, Elsie Lincoln Gratia Disla   01/02/2022, 2:51 PM

## 2022-01-05 NOTE — Telephone Encounter (Signed)
   Name: Albert [name]  DOB: February 06, 1951  MRN: [id number]  Primary Cardiologist: None  Chart reviewed as part of pre-operative protocol coverage. Because of Nadir Vasques. Cafarella's past medical history and time since last visit, he will require a follow-up in-office visit in order to better assess preoperative cardiovascular risk.  Pre-op covering staff: - Please schedule appointment and call patient to inform them. If patient already had an upcoming appointment within acceptable timeframe, please add "pre-op clearance" to the appointment notes so provider is aware. - Please contact requesting surgeon's office via preferred method (i.e, phone, fax) to inform them of need for appointment prior to surgery.  Patient is on Xarelto but is not managed by cardiology.  This will need to be addressed by requesting surgeon's office prior to preoperative appointment.   Mable Fill, Marissa Nestle, NP  01/05/2022, 9:33 AM

## 2022-01-05 NOTE — Telephone Encounter (Signed)
Xarelto is managed by heme-onc Dr. Lindi Adie. I will defer to them. It looks like from notes they are already aware.

## 2022-01-05 NOTE — Telephone Encounter (Signed)
I s/w the pt and he is agreeable to plan of care for in office pre op appt. Pt has been scheduled to see Diona Browner, NP 01/16/22 @ 1:55 at the [facility] office. Pt does tell me that his surgery is not only for hernia surgery. Pt tells me the hernia surgery is due to chronic appendicitis. Pt tells me the surgery is actually going to be appendectomy and hernia surgery. Pt tells me the surgery will not be until at least 01/22/22.   I thanked the pt for his help in clarifying his surgery procedure. I will forward notes to NP for appt.  Will send FYI to requesting office pt has appt 01/16/22.

## 2022-01-16 ENCOUNTER — Ambulatory Visit: Payer: PPO | Admitting: Nurse Practitioner

## 2022-01-16 ENCOUNTER — Encounter: Payer: Self-pay | Admitting: Nurse Practitioner

## 2022-01-16 VITALS — BP 130/60 | HR 72 | Ht 70.0 in | Wt 187.0 lb

## 2022-01-16 DIAGNOSIS — E785 Hyperlipidemia, unspecified: Secondary | ICD-10-CM

## 2022-01-16 DIAGNOSIS — Z01810 Encounter for preprocedural cardiovascular examination: Secondary | ICD-10-CM | POA: Diagnosis not present

## 2022-01-16 DIAGNOSIS — I82409 Acute embolism and thrombosis of unspecified deep veins of unspecified lower extremity: Secondary | ICD-10-CM

## 2022-01-16 DIAGNOSIS — I10 Essential (primary) hypertension: Secondary | ICD-10-CM | POA: Diagnosis not present

## 2022-01-16 DIAGNOSIS — I340 Nonrheumatic mitral (valve) insufficiency: Secondary | ICD-10-CM | POA: Diagnosis not present

## 2022-01-16 DIAGNOSIS — I341 Nonrheumatic mitral (valve) prolapse: Secondary | ICD-10-CM

## 2022-01-16 NOTE — Patient Instructions (Signed)
Medication Instructions:  Your physician recommends that you continue on your current medications as directed. Please refer to the Current Medication list given to you today.   *If you need a refill on your cardiac medications before your next appointment, please call your pharmacy*   Lab Work: NONE ordered at this time of appointment   If you have labs (blood work) drawn today and your tests are completely normal, you will receive your results only by: [place] (if you have MyChart) OR A paper copy in the mail If you have any lab test that is abnormal or we need to change your treatment, we will call you to review the results.   Testing/Procedures: NONE ordered at this time of appointment    Follow-Up: At [facility], you and your health needs are our priority.  As part of our continuing mission to provide you with exceptional heart care, we have created designated Provider Care Teams.  These Care Teams include your primary Cardiologist (physician) and Advanced Practice Providers (APPs -  Physician Assistants and Nurse Practitioners) who all work together to provide you with the care you need, when you need it.  We recommend signing up for the patient portal called "MyChart".  Sign up information is provided on this After Visit Summary.  MyChart is used to connect with patients for Virtual Visits (Telemedicine).  Patients are able to view lab/test results, encounter notes, upcoming appointments, etc.  Non-urgent messages can be sent to your provider as well.   To learn more about what you can do with MyChart, go to [email or web address].    Your next appointment:   6 month(s)  The format for your next appointment:   In Person  Provider:   Glenetta Hew, MD     Other Instructions   Important Information About Sugar

## 2022-01-16 NOTE — Progress Notes (Signed)
Office Visit    Patient Name: Albert [name]. Carolan Date of Encounter: 01/16/2022  Primary Care Provider:  Antony Contras, MD Primary Cardiologist:  Glenetta Hew, MD  Chief Complaint    71 year old male with a history of mitral valve prolapse mild MR, frequent PVCs, hypertension, hyperlipidemia, recurrent DVT on Xarelto who presents for preoperative cardiac evaluation.  Past Medical History    Past Medical History:  Diagnosis Date   Acute deep vein thrombosis of lower limb ([place]) 2005   Initial episode in 2005 with DVT and PE following low back surgery.  [Reportedly negative evaluation from second episode right leg DVT 2007 (1/2 to 1-year); third episode in 2016 now on lifetime anticoagulation.  (Does home INR checks warfarin).   Chronic lower back pain    Frequent PVCs    With occasional cardiac, controlled on Toprol 25 mg   GERD (gastroesophageal reflux disease)    History of anticoagulant therapy    Hypercholesterolemia    Not currently on treatment   Mitral valve prolapse    By historical data.  Not confirmed in 2017.   Pulmonary embolism ([place])    History of multiple DVTs with PE, on chronic anticoagulation (currently on warfarin with potential conversion to Xarelto)   Tinnitus    Past Surgical History:  Procedure Laterality Date   COLONOSCOPY  2007   LUMBAR SPINE SURGERY  2006   TONSILLECTOMY     TRANSTHORACIC ECHOCARDIOGRAM  06/2015   LVEF 60 to 65%.  No RWMA.  GR 1 DD.  Mild MR (no mention of MVP).  Normal left atrial size.  Otherwise normal valves chamber sizes.   TRANSTHORACIC ECHOCARDIOGRAM  07/2020   EF 60 to 65%.  No or WMA.  Normal RV.  Mild to moderate MR with mild late systolic prolapse of the middle scallop of the posterior leaflet.  Aortic valve sclerosis but no stenosis.  Normal RAP.    Allergies  Allergies  Allergen Reactions   Aspirin    Nsaids    Other Other (See Comments)    Do not give due to chronic anticoagulation on coumadin.   Penicillins      History of Present Illness    71 year old male with the above past medical history including mitral valve prolapse mild MR, frequent PVCs, hypertension, hyperlipidemia, recurrent DVT on Xarelto.  Most recent echocardiogram in February 2022 shows EF 60 to 65%, no RWMA, mild to moderate mitral valve regurgitation, mild late systolic prolapse of the middle scallop of the posterior leaflet of the mitral valve, mild aortic valve sclerosis with no evidence of aortic valve stenosis.  He was last seen in the office on 07/14/2021 ported improvement in palpitations on metoprolol.  He denied dyspnea, denied symptoms concerning for angina.  He presents today for follow-up and for preoperative cardiac evaluation for upcoming hernia surgery with appendectomy with Dr. Janice Coffin surgery. Since his last visit he has been stable from a cardiac standpoint.  He is in the process of moving from [place] to [place] to be closer to his son and daughter-in-law.  He is very active.  He denies any palpitations, dyspnea, denies symptoms concerning for angina.  Overall, he reports feeling well and denies any new concerns today.  Home Medications    Current Outpatient Medications  Medication Sig Dispense Refill   diphenhydramine-acetaminophen (TYLENOL PM EXTRA STRENGTH) 25-500 MG TABS tablet at bedtime as needed.     Famotidine-Ca Carb-Mag Hydrox (PEPCID COMPLETE PO) 20 mg.  LORazepam (ATIVAN) 2 MG tablet Take 1 tablet (2 mg total) by mouth at bedtime as needed for anxiety. 30 tablet    metoprolol succinate (TOPROL-XL) 50 MG 24 hr tablet TAKE 1 TABLET BY MOUTH DAILY. TAKE WITH OR IMMEDIATELY FOLLOWING A MEAL. 90 tablet 3   rivaroxaban (XARELTO) 20 MG TABS tablet Take 1 tablet (20 mg total) by mouth daily with supper. 90 tablet 3   metoprolol tartrate (LOPRESSOR) 25 MG tablet Take 1 tablet (25 mg total) by mouth 2 (two) times daily as needed. Breakthrough of PVC. Continue taking Metoprolol  succinate daily 30 tablet 6   No current facility-administered medications for this visit.     Review of Systems    He denies chest pain, palpitations, dyspnea, pnd, orthopnea, n, v, dizziness, syncope, edema, weight gain, or early satiety. All other systems reviewed and are otherwise negative except as noted above.   Physical Exam    VS:  BP 130/60   Pulse 72   Ht '5\' 10"'$  (1.778 m)   Wt 187 lb (84.8 kg)   SpO2 97%   BMI 26.83 kg/m   GEN: Well nourished, well developed, in no acute distress. HEENT: normal. Neck: Supple, no JVD, carotid bruits, or masses. Cardiac: RRR, no murmurs, rubs, or gallops. No clubbing, cyanosis, edema.  Radials/DP/PT 2+ and equal bilaterally.  Respiratory:  Respirations regular and unlabored, clear to auscultation bilaterally. GI: Soft, nontender, nondistended, BS + x 4. MS: no deformity or atrophy. Skin: warm and dry, no rash. Neuro:  Strength and sensation are intact. Psych: Normal affect.  Accessory Clinical Findings    ECG personally reviewed by me today -NSR, 72 bpm- no acute changes.  No results found for: "WBC", "HGB", "HCT", "MCV", "PLT" No results found for: "CREATININE", "BUN", "NA", "K", "CL", "CO2" No results found for: "ALT", "AST", "GGT", "ALKPHOS", "BILITOT" No results found for: "CHOL", "HDL", "LDLCALC", "LDLDIRECT", "TRIG", "CHOLHDL"  No results found for: "HGBA1C"  Assessment & Plan    1. 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  Asymptomatic.  Plan for repeat echo in 2024-2025.  2. Hyperlipidemia: LDL was 115 in 12/2020. Monitored and managed by PCP.  Not on statin.  3. Hypertension: BP well controlled. Continue current antihypertensive regimen.   4. Recurrent  DVT: On Xarelto.  Follows with heme-onc.   5. Preoperative cardiac exam: According to the Revised Cardiac Risk Index (RCRI), his Perioperative Risk of Major Cardiac Event is (%): 0.4. His Functional Capacity in METs is: 7.99 according to the [facility] Activity Status Index (DASI). Alveda Reasons is managed by Dr. Lindi Adie (hematology-oncology).  Recommendations for holding Xarelto prior to surgery will need to come from prescribing/managing provider. Based on ACC/AHA guidelines, patient would be at acceptable risk for the planned procedure without further cardiovascular testing. I will route this recommendation to the requesting party via Epic fax function.  6. Disposition: Follow-up in 6 months.   Lenna Sciara, NP 01/16/2022, 2:02 PM

## 2022-01-19 ENCOUNTER — Other Ambulatory Visit: Payer: Self-pay | Admitting: *Deleted

## 2022-01-19 ENCOUNTER — Encounter: Payer: Self-pay | Admitting: *Deleted

## 2022-01-19 DIAGNOSIS — I493 Ventricular premature depolarization: Secondary | ICD-10-CM | POA: Diagnosis not present

## 2022-01-19 DIAGNOSIS — D6869 Other thrombophilia: Secondary | ICD-10-CM | POA: Diagnosis not present

## 2022-01-19 DIAGNOSIS — G4700 Insomnia, unspecified: Secondary | ICD-10-CM | POA: Diagnosis not present

## 2022-01-19 DIAGNOSIS — Z86711 Personal history of pulmonary embolism: Secondary | ICD-10-CM | POA: Diagnosis not present

## 2022-01-19 DIAGNOSIS — I10 Essential (primary) hypertension: Secondary | ICD-10-CM | POA: Diagnosis not present

## 2022-01-19 DIAGNOSIS — E7800 Pure hypercholesterolemia, unspecified: Secondary | ICD-10-CM | POA: Diagnosis not present

## 2022-01-19 DIAGNOSIS — K36 Other appendicitis: Secondary | ICD-10-CM | POA: Diagnosis not present

## 2022-01-19 DIAGNOSIS — I341 Nonrheumatic mitral (valve) prolapse: Secondary | ICD-10-CM | POA: Diagnosis not present

## 2022-01-19 MED ORDER — ENOXAPARIN SODIUM 80 MG/0.8ML IJ SOSY: 80.0000 mg | PREFILLED_SYRINGE | Freq: Two times a day (BID) | INTRAMUSCULAR | 0 refills | Status: DC

## 2022-01-19 NOTE — Progress Notes (Signed)
Received message from pt stating he will be scheduled for appendectomy within the next few weeks. Per MD pt needing to stop Xarelto 3 days prior to surgery and self administer Lovenox 80 mg Subcu BID, nothing day of surgery, and resume Xarelto day after surgery.  Prescription for Lovenox 80 mg subcu BID sent to pharmacy on file.  Pt educated and verbalized understanding.

## 2022-01-19 NOTE — Progress Notes (Signed)
RN successfully faxed medical clearance to Dr. Georgette Dover at [phone].

## 2022-01-23 NOTE — Progress Notes (Signed)
COVID Vaccine Completed:  Date of COVID positive in last 90 days:  PCP - Antony Contras, MD Cardiologist - Glenetta Hew, MD  Cardiac clearance by Diona Browner 01/16/22 in Epic   Chest x-ray -  EKG -  Stress Test -  ECHO - 07/17/20 Epic Cardiac Cath -  Pacemaker/ICD device last checked: Spinal Cord Stimulator:  Bowel Prep -   Sleep Study -  CPAP -   Fasting Blood Sugar -  Checks Blood Sugar _____ times a day  Blood Thinner Instructions: Xarelto, hold 3 days, Lovenox bridge Aspirin Instructions: Last Dose:  Activity level:  Can go up a flight of stairs and perform activities of daily living without stopping and without symptoms of chest pain or shortness of breath.  Able to exercise without symptoms  Unable to go up a flight of stairs without symptoms of     Anesthesia review: MVP, DVT, PE, HTN  Patient denies shortness of breath, fever, cough and chest pain at PAT appointment  Patient verbalized understanding of instructions that were given to them at the PAT appointment. Patient was also instructed that they will need to review over the PAT instructions again at home before surgery.

## 2022-01-27 ENCOUNTER — Encounter (HOSPITAL_COMMUNITY): Payer: Self-pay

## 2022-01-27 ENCOUNTER — Encounter (HOSPITAL_COMMUNITY)
Admission: RE | Admit: 2022-01-27 | Discharge: 2022-01-27 | Disposition: A | Discharge status: Home / Self Care | Payer: PPO | Source: Ambulatory Visit | Attending: Surgery | Admitting: Surgery

## 2022-01-27 VITALS — BP 132/79 | HR 72 | Temp 98.1°F | Resp 12 | Ht 70.0 in | Wt 181.8 lb

## 2022-01-27 DIAGNOSIS — I10 Essential (primary) hypertension: Secondary | ICD-10-CM | POA: Diagnosis not present

## 2022-01-27 DIAGNOSIS — Z01812 Encounter for preprocedural laboratory examination: Secondary | ICD-10-CM | POA: Diagnosis not present

## 2022-01-27 HISTORY — DX: Essential (primary) hypertension: I10

## 2022-01-27 HISTORY — DX: Malignant (primary) neoplasm, unspecified: C80.1

## 2022-01-27 HISTORY — DX: Unspecified osteoarthritis, unspecified site: M19.90

## 2022-01-27 LAB — CBC
HCT: 41.7 % (ref 39.0–52.0)
Hemoglobin: 14.1 g/dL (ref 13.0–17.0)
MCH: 33.7 pg (ref 26.0–34.0)
MCHC: 33.8 g/dL (ref 30.0–36.0)
MCV: 99.5 fL (ref 80.0–100.0)
Platelets: 182 10*3/uL (ref 150–400)
RBC: 4.19 MIL/uL — ABNORMAL LOW (ref 4.22–5.81)
RDW: 12.6 % (ref 11.5–15.5)
WBC: 5.6 10*3/uL (ref 4.0–10.5)
nRBC: 0 % (ref 0.0–0.2)

## 2022-01-27 LAB — BASIC METABOLIC PANEL
Anion gap: 8 (ref 5–15)
BUN: 12 mg/dL (ref 8–23)
CO2: 25 mmol/L (ref 22–32)
Calcium: 9.4 mg/dL (ref 8.9–10.3)
Chloride: 105 mmol/L (ref 98–111)
Creatinine, Ser: 1.05 mg/dL (ref 0.61–1.24)
GFR, Estimated: >60 mL/min (ref >60)
Glucose, Bld: 127 mg/dL — ABNORMAL HIGH (ref 70–99)
Potassium: 4.5 mmol/L (ref 3.5–5.1)
Sodium: 138 mmol/L (ref 135–145)

## 2022-01-27 NOTE — Patient Instructions (Signed)
SURGICAL WAITING ROOM VISITATION Patients having surgery or a procedure may have no more than 2 support people in the waiting area - these visitors may rotate.   Children under the age of 9 must have an adult with them who is not the patient. If the patient needs to stay at the hospital during part of their recovery, the visitor guidelines for inpatient rooms apply. Pre-op nurse will coordinate an appropriate time for 1 support person to accompany patient in pre-op.  This support person may not rotate.    Please refer to the [facility] website for the visitor guidelines for Inpatients (after your surgery is over and you are in a regular room).     Your procedure is scheduled on: 01/29/22   Report to [facility] Main Entrance    Report to admitting at 8:45 AM   Call this number if you have problems the morning of surgery [phone]   Do not eat food :After Midnight.   After Midnight you may have the following liquids until 5:00 AM DAY OF SURGERY  Water Non-Citrus Juices (without pulp, NO RED) Carbonated Beverages Black Coffee (NO MILK/CREAM OR CREAMERS, sugar ok)  Clear Tea (NO MILK/CREAM OR CREAMERS, sugar ok) regular and decaf                             Plain Jell-O (NO RED)                                           Fruit ices (not with fruit pulp, NO RED)                                     Popsicles (NO RED)                                                               Sports drinks like Gatorade (NO RED)  FOLLOW BOWEL PREP AND ANY ADDITIONAL PRE OP INSTRUCTIONS YOU RECEIVED FROM YOUR SURGEON'S OFFICE!!!     Oral Hygiene is also important to reduce your risk of infection.                                    Remember - BRUSH YOUR TEETH THE MORNING OF SURGERY WITH YOUR REGULAR TOOTHPASTE   Take these medicines the morning of surgery with A SIP OF WATER: Tylenol, Pepcid  These are anesthesia recommendations for holding your anticoagulants.  Please contact your  prescribing physician to confirm IF it is safe to hold your anticoagulants for this length of time.   Eliquis Apixaban   72 hours   Xarelto Rivaroxaban   72 hours  Plavix Clopidogrel   120 hours  Pletal Cilostazol   120 hours                                 You may not have any metal on your body including  jewelry, and body piercing             Do not wear lotions, powders, cologne, or deodorant              Men may shave face and neck.   Do not bring valuables to the hospital. [place].  DO NOT [place]. PHARMACY WILL DISPENSE MEDICATIONS LISTED ON YOUR MEDICATION LIST TO YOU DURING YOUR ADMISSION [place]!    Patients discharged on the day of surgery will not be allowed to drive home.  Someone NEEDS to stay with you for the first 24 hours after anesthesia.              Please read over the following fact sheets you were given: IF YOU HAVE QUESTIONS ABOUT YOUR PRE-OP INSTRUCTIONS PLEASE CALL [facility] - Preparing for Surgery Before surgery, you can play an important role.  Because skin is not sterile, your skin needs to be as free of germs as possible.  You can reduce the number of germs on your skin by washing with CHG (chlorahexidine gluconate) soap before surgery.  CHG is an antiseptic cleaner which kills germs and bonds with the skin to continue killing germs even after washing. Please DO NOT use if you have an allergy to CHG or antibacterial soaps.  If your skin becomes reddened/irritated stop using the CHG and inform your nurse when you arrive at Short Stay. Do not shave (including legs and underarms) for at least 48 hours prior to the first CHG shower.  You may shave your face/neck.  Please follow these instructions carefully:  1.  Shower with CHG Soap the night before surgery and the  morning of surgery.  2.  If you choose to wash your hair, wash your hair  first as usual with your normal  shampoo.  3.  After you shampoo, rinse your hair and body thoroughly to remove the shampoo.                             4.  Use CHG as you would any other liquid soap.  You can apply chg directly to the skin and wash.  Gently with a scrungie or clean washcloth.  5.  Apply the CHG Soap to your body ONLY FROM THE NECK DOWN.   Do   not use on face/ open                           Wound or open sores. Avoid contact with eyes, ears mouth and   genitals (private parts).                       Wash face,  Genitals (private parts) with your normal soap.             6.  Wash thoroughly, paying special attention to the area where your    surgery  will be performed.  7.  Thoroughly rinse your body with warm water from the neck down.  8.  DO NOT shower/wash with your normal soap after using and rinsing off the CHG Soap.                9.  Fraser Din  yourself dry with a clean towel.            10.  Wear clean pajamas.            11.  Place clean sheets on your bed the night of your first shower and do not  sleep with pets. Day of Surgery : Do not apply any lotions/deodorants the morning of surgery.  Please wear clean clothes to the hospital/surgery center.  FAILURE TO FOLLOW THESE INSTRUCTIONS MAY RESULT IN THE CANCELLATION OF YOUR SURGERY  PATIENT SIGNATURE_________________________________  NURSE SIGNATURE__________________________________  ________________________________________________________________________

## 2022-01-28 NOTE — Anesthesia Preprocedure Evaluation (Signed)
Anesthesia Evaluation  Patient identified by MRN, date of birth, ID band Patient awake    Reviewed: Allergy & Precautions, NPO status , Patient's Chart, lab work & pertinent test results, reviewed documented beta blocker date and time   History of Anesthesia Complications Negative for: history of anesthetic complications  Airway Mallampati: II  TM Distance: >3 FB Neck ROM: Full    Dental  (+) Dental Advisory Given   Pulmonary PE   breath sounds clear to auscultation       Cardiovascular hypertension, Pt. on medications and Pt. on home beta blockers + DVT  + Valvular Problems/Murmurs MR and MVP  Rhythm:Regular Rate:Normal + Systolic murmurs '22 ECHO: EF 60-65%, normal LVF, normal RVF, mild MV P2 prolapse with mild-mod MR   Neuro/Psych negative neurological ROS     GI/Hepatic Neg liver ROS, GERD  Medicated and Controlled,  Endo/Other  negative endocrine ROS  Renal/GU negative Renal ROS     Musculoskeletal  (+) Arthritis ,   Abdominal   Peds  Hematology xarelto   Anesthesia Other Findings   Reproductive/Obstetrics                            Anesthesia Physical Anesthesia Plan  ASA: 3  Anesthesia Plan: General   Post-op Pain Management: Tylenol PO (pre-op)*   Induction: Intravenous  PONV Risk Score and Plan: 2 and Ondansetron and Dexamethasone  Airway Management Planned: Oral ETT  Additional Equipment: None  Intra-op Plan:   Post-operative Plan: Extubation in OR  Informed Consent: I have reviewed the patients History and Physical, chart, labs and discussed the procedure including the risks, benefits and alternatives for the proposed anesthesia with the patient or authorized representative who has indicated his/her understanding and acceptance.     Dental advisory given  Plan Discussed with: CRNA and Surgeon  Anesthesia Plan Comments:        Anesthesia Quick  Evaluation

## 2022-01-29 ENCOUNTER — Ambulatory Visit (HOSPITAL_COMMUNITY): Payer: PPO | Admitting: Physician Assistant

## 2022-01-29 ENCOUNTER — Other Ambulatory Visit: Payer: Self-pay

## 2022-01-29 ENCOUNTER — Encounter (HOSPITAL_COMMUNITY)
Admission: RE | Disposition: A | Discharge status: Home / Self Care | Payer: Self-pay | Source: Home / Self Care | Attending: Surgery

## 2022-01-29 ENCOUNTER — Encounter (HOSPITAL_COMMUNITY): Payer: Self-pay | Admitting: Surgery

## 2022-01-29 ENCOUNTER — Ambulatory Visit (HOSPITAL_COMMUNITY)
Admission: RE | Admit: 2022-01-29 | Discharge: 2022-01-29 | Disposition: A | Discharge status: Home / Self Care | Payer: PPO | Attending: Surgery | Admitting: Surgery

## 2022-01-29 ENCOUNTER — Ambulatory Visit (HOSPITAL_BASED_OUTPATIENT_CLINIC_OR_DEPARTMENT_OTHER): Payer: PPO | Admitting: Anesthesiology

## 2022-01-29 DIAGNOSIS — K55069 Acute infarction of intestine, part and extent unspecified: Secondary | ICD-10-CM | POA: Diagnosis not present

## 2022-01-29 DIAGNOSIS — K388 Other specified diseases of appendix: Secondary | ICD-10-CM

## 2022-01-29 DIAGNOSIS — K429 Umbilical hernia without obstruction or gangrene: Secondary | ICD-10-CM | POA: Diagnosis not present

## 2022-01-29 DIAGNOSIS — Z86711 Personal history of pulmonary embolism: Secondary | ICD-10-CM | POA: Insufficient documentation

## 2022-01-29 DIAGNOSIS — I10 Essential (primary) hypertension: Secondary | ICD-10-CM | POA: Insufficient documentation

## 2022-01-29 DIAGNOSIS — Z86718 Personal history of other venous thrombosis and embolism: Secondary | ICD-10-CM | POA: Diagnosis not present

## 2022-01-29 DIAGNOSIS — M1990 Unspecified osteoarthritis, unspecified site: Secondary | ICD-10-CM

## 2022-01-29 DIAGNOSIS — Z7901 Long term (current) use of anticoagulants: Secondary | ICD-10-CM | POA: Insufficient documentation

## 2022-01-29 DIAGNOSIS — K219 Gastro-esophageal reflux disease without esophagitis: Secondary | ICD-10-CM | POA: Insufficient documentation

## 2022-01-29 DIAGNOSIS — K3580 Unspecified acute appendicitis: Secondary | ICD-10-CM | POA: Diagnosis not present

## 2022-01-29 DIAGNOSIS — K36 Other appendicitis: Secondary | ICD-10-CM | POA: Insufficient documentation

## 2022-01-29 DIAGNOSIS — K4090 Unilateral inguinal hernia, without obstruction or gangrene, not specified as recurrent: Secondary | ICD-10-CM | POA: Insufficient documentation

## 2022-01-29 DIAGNOSIS — R10813 Right lower quadrant abdominal tenderness: Secondary | ICD-10-CM | POA: Diagnosis present

## 2022-01-29 HISTORY — PX: LAPAROSCOPIC APPENDECTOMY: SHX408

## 2022-01-29 HISTORY — PX: UMBILICAL HERNIA REPAIR: SHX196

## 2022-01-29 SURGERY — APPENDECTOMY, LAPAROSCOPIC
Anesthesia: General

## 2022-01-29 MED ORDER — CHLORHEXIDINE GLUCONATE CLOTH 2 % EX PADS: 6.0000 | MEDICATED_PAD | Freq: Once | CUTANEOUS | Status: DC

## 2022-01-29 MED ORDER — PROMETHAZINE HCL 25 MG/ML IJ SOLN: 6.2500 mg | INTRAMUSCULAR | Status: DC | PRN

## 2022-01-29 MED ORDER — ONDANSETRON HCL 4 MG/2ML IJ SOLN
INTRAMUSCULAR | Status: DC | PRN
  Administered 2022-01-29: 4 mg via INTRAVENOUS

## 2022-01-29 MED ORDER — CHLORHEXIDINE GLUCONATE 0.12 % MT SOLN
15.0000 mL | Freq: Once | OROMUCOSAL | Status: AC
  Administered 2022-01-29: 15 mL via OROMUCOSAL

## 2022-01-29 MED ORDER — MIDAZOLAM HCL 2 MG/2ML IJ SOLN
INTRAMUSCULAR | Status: AC
  Filled 2022-01-29: qty 2

## 2022-01-29 MED ORDER — PROPOFOL 10 MG/ML IV BOLUS
INTRAVENOUS | Status: AC
  Filled 2022-01-29: qty 20

## 2022-01-29 MED ORDER — FENTANYL CITRATE (PF) 100 MCG/2ML IJ SOLN
INTRAMUSCULAR | Status: AC
  Filled 2022-01-29: qty 2

## 2022-01-29 MED ORDER — LACTATED RINGERS IV SOLN: INTRAVENOUS | Status: DC | PRN

## 2022-01-29 MED ORDER — OXYCODONE HCL 5 MG PO TABS: 5.0000 mg | ORAL_TABLET | Freq: Four times a day (QID) | ORAL | 0 refills | Status: DC | PRN

## 2022-01-29 MED ORDER — ROCURONIUM BROMIDE 100 MG/10ML IV SOLN
INTRAVENOUS | Status: DC | PRN
  Administered 2022-01-29: 50 mg via INTRAVENOUS

## 2022-01-29 MED ORDER — ACETAMINOPHEN 500 MG PO TABS: 1000.0000 mg | ORAL_TABLET | Freq: Once | ORAL | Status: DC

## 2022-01-29 MED ORDER — OXYCODONE HCL 5 MG PO TABS: 5.0000 mg | ORAL_TABLET | Freq: Once | ORAL | Status: DC | PRN

## 2022-01-29 MED ORDER — 0.9 % SODIUM CHLORIDE (POUR BTL) OPTIME
TOPICAL | Status: DC | PRN
  Administered 2022-01-29: 1000 mL

## 2022-01-29 MED ORDER — LACTATED RINGERS IV SOLN: INTRAVENOUS | Status: DC

## 2022-01-29 MED ORDER — OXYCODONE HCL 5 MG/5ML PO SOLN: 5.0000 mg | Freq: Once | ORAL | Status: DC | PRN

## 2022-01-29 MED ORDER — MIDAZOLAM HCL 5 MG/5ML IJ SOLN
INTRAMUSCULAR | Status: DC | PRN
  Administered 2022-01-29: 2 mg via INTRAVENOUS

## 2022-01-29 MED ORDER — ORAL CARE MOUTH RINSE: 15.0000 mL | Freq: Once | OROMUCOSAL | Status: AC

## 2022-01-29 MED ORDER — BUPIVACAINE-EPINEPHRINE (PF) 0.5% -1:200000 IJ SOLN
INTRAMUSCULAR | Status: DC | PRN
  Administered 2022-01-29: 14 mL via PERINEURAL

## 2022-01-29 MED ORDER — BUPIVACAINE-EPINEPHRINE (PF) 0.5% -1:200000 IJ SOLN
INTRAMUSCULAR | Status: AC
  Filled 2022-01-29: qty 30

## 2022-01-29 MED ORDER — HYDROMORPHONE HCL 1 MG/ML IJ SOLN
0.2500 mg | INTRAMUSCULAR | Status: DC | PRN
  Administered 2022-01-29 (×2): 0.5 mg via INTRAVENOUS

## 2022-01-29 MED ORDER — ROCURONIUM BROMIDE 10 MG/ML (PF) SYRINGE
PREFILLED_SYRINGE | INTRAVENOUS | Status: AC
  Filled 2022-01-29: qty 10

## 2022-01-29 MED ORDER — MIDAZOLAM HCL 2 MG/2ML IJ SOLN: 0.5000 mg | Freq: Once | INTRAMUSCULAR | Status: DC | PRN

## 2022-01-29 MED ORDER — PHENYLEPHRINE 80 MCG/ML (10ML) SYRINGE FOR IV PUSH (FOR BLOOD PRESSURE SUPPORT)
PREFILLED_SYRINGE | INTRAVENOUS | Status: DC | PRN
  Administered 2022-01-29: 80 ug via INTRAVENOUS

## 2022-01-29 MED ORDER — CEFAZOLIN SODIUM-DEXTROSE 2-4 GM/100ML-% IV SOLN
2.0000 g | INTRAVENOUS | Status: AC
  Administered 2022-01-29: 2 g via INTRAVENOUS
  Filled 2022-01-29: qty 100

## 2022-01-29 MED ORDER — PROPOFOL 10 MG/ML IV BOLUS
INTRAVENOUS | Status: DC | PRN
  Administered 2022-01-29: 120 mg via INTRAVENOUS

## 2022-01-29 MED ORDER — LIDOCAINE HCL (PF) 2 % IJ SOLN
INTRAMUSCULAR | Status: AC
  Filled 2022-01-29: qty 5

## 2022-01-29 MED ORDER — MEPERIDINE HCL 50 MG/ML IJ SOLN: 6.2500 mg | INTRAMUSCULAR | Status: DC | PRN

## 2022-01-29 MED ORDER — SUGAMMADEX SODIUM 200 MG/2ML IV SOLN
INTRAVENOUS | Status: DC | PRN
  Administered 2022-01-29: 200 mg via INTRAVENOUS

## 2022-01-29 MED ORDER — ACETAMINOPHEN 500 MG PO TABS
1000.0000 mg | ORAL_TABLET | ORAL | Status: DC
  Filled 2022-01-29: qty 2

## 2022-01-29 MED ORDER — FENTANYL CITRATE (PF) 100 MCG/2ML IJ SOLN
INTRAMUSCULAR | Status: DC | PRN
  Administered 2022-01-29 (×2): 50 ug via INTRAVENOUS
  Administered 2022-01-29: 100 ug via INTRAVENOUS

## 2022-01-29 MED ORDER — LIDOCAINE HCL (CARDIAC) PF 100 MG/5ML IV SOSY
PREFILLED_SYRINGE | INTRAVENOUS | Status: DC | PRN
  Administered 2022-01-29: 40 mg via INTRAVENOUS

## 2022-01-29 MED ORDER — HYDROMORPHONE HCL 1 MG/ML IJ SOLN
INTRAMUSCULAR | Status: AC
  Filled 2022-01-29: qty 1

## 2022-01-29 SURGICAL SUPPLY — 61 items
APPLIER CLIP 5 13 M/L LIGAMAX5 (MISCELLANEOUS)
APPLIER CLIP ROT 10 11.4 M/L (STAPLE)
BAG COUNTER SPONGE SURGICOUNT (BAG) IMPLANT
BENZOIN TINCTURE PRP APPL 2/3 (GAUZE/BANDAGES/DRESSINGS) ×1 IMPLANT
BINDER ABDOMINAL 12 ML 46-62 (SOFTGOODS) ×1 IMPLANT
CABLE HIGH FREQUENCY MONO STRZ (ELECTRODE) IMPLANT
CLIP APPLIE 5 13 M/L LIGAMAX5 (MISCELLANEOUS) IMPLANT
CLIP APPLIE ROT 10 11.4 M/L (STAPLE) IMPLANT
COVER SURGICAL LIGHT HANDLE (MISCELLANEOUS) ×1 IMPLANT
CUTTER FLEX LINEAR 45M (STAPLE) IMPLANT
DERMABOND ADVANCED (GAUZE/BANDAGES/DRESSINGS)
DERMABOND ADVANCED .7 DNX12 (GAUZE/BANDAGES/DRESSINGS) IMPLANT
DEVICE TROCAR PUNCTURE CLOSURE (ENDOMECHANICALS) ×1 IMPLANT
DRAIN CHANNEL RND F F (WOUND CARE) IMPLANT
DRAPE LAPAROSCOPIC ABDOMINAL (DRAPES) ×1 IMPLANT
DRAPE UTILITY XL STRL (DRAPES) ×1 IMPLANT
DRSG TEGADERM 2-3/8X2-3/4 SM (GAUZE/BANDAGES/DRESSINGS) IMPLANT
ELECT REM PT RETURN 15FT ADLT (MISCELLANEOUS) ×1 IMPLANT
EVACUATOR SILICONE 100CC (DRAIN) IMPLANT
GAUZE SPONGE 2X2 8PLY STRL LF (GAUZE/BANDAGES/DRESSINGS) IMPLANT
GLOVE BIO SURGEON STRL SZ7 (GLOVE) ×1 IMPLANT
GLOVE BIOGEL PI IND STRL 7.0 (GLOVE) ×1 IMPLANT
GLOVE BIOGEL PI IND STRL 7.5 (GLOVE) ×1 IMPLANT
GLOVE BIOGEL PI INDICATOR 7.0 (GLOVE) ×1
GLOVE BIOGEL PI INDICATOR 7.5 (GLOVE) ×1
GOWN STRL REUS W/ TWL LRG LVL3 (GOWN DISPOSABLE) ×2 IMPLANT
GOWN STRL REUS W/TWL LRG LVL3 (GOWN DISPOSABLE) ×2
IRRIG SUCT STRYKERFLOW 2 WTIP (MISCELLANEOUS) ×1
IRRIGATION SUCT STRKRFLW 2 WTP (MISCELLANEOUS) ×1 IMPLANT
IV LACTATED RINGERS 1000ML (IV SOLUTION) ×1 IMPLANT
KIT BASIN OR (CUSTOM PROCEDURE TRAY) ×1 IMPLANT
KIT TURNOVER KIT A (KITS) IMPLANT
MARKER SKIN DUAL TIP RULER LAB (MISCELLANEOUS) ×1 IMPLANT
NDL SPNL 22GX3.5 QUINCKE BK (NEEDLE) ×1 IMPLANT
NEEDLE SPNL 22GX3.5 QUINCKE BK (NEEDLE) ×1 IMPLANT
NS IRRIG 1000ML POUR BTL (IV SOLUTION) ×1 IMPLANT
PENCIL SMOKE EVACUATOR (MISCELLANEOUS) IMPLANT
RELOAD 45 VASCULAR/THIN (ENDOMECHANICALS) IMPLANT
RELOAD STAPLE 45 2.5 WHT GRN (ENDOMECHANICALS) IMPLANT
RELOAD STAPLE 45 3.5 BLU ETS (ENDOMECHANICALS) IMPLANT
RELOAD STAPLE TA45 3.5 REG BLU (ENDOMECHANICALS) ×1 IMPLANT
SCISSORS LAP 5X35 DISP (ENDOMECHANICALS) ×1 IMPLANT
SET TUBE SMOKE EVAC HIGH FLOW (TUBING) ×1 IMPLANT
SHEARS HARMONIC ACE PLUS 36CM (ENDOMECHANICALS) IMPLANT
SLEEVE ADV FIXATION 5X100MM (TROCAR) IMPLANT
SLEEVE Z-THREAD 5X100MM (TROCAR) ×1 IMPLANT
SOL ANTI FOG 6CC (MISCELLANEOUS) ×1 IMPLANT
SOLUTION ANTI FOG 6CC (MISCELLANEOUS) ×1
SPIKE FLUID TRANSFER (MISCELLANEOUS) ×1 IMPLANT
STRIP CLOSURE SKIN 1/2X4 (GAUZE/BANDAGES/DRESSINGS) ×1 IMPLANT
SUT MNCRL AB 4-0 PS2 18 (SUTURE) ×1 IMPLANT
SUT NOVA 0 T19/GS 22DT (SUTURE) IMPLANT
SUT VICRYL 0 ENDOLOOP (SUTURE) IMPLANT
SYS BAG RETRIEVAL 10MM (BASKET) ×1
SYSTEM BAG RETRIEVAL 10MM (BASKET) ×1 IMPLANT
TOWEL OR 17X26 10 PK STRL BLUE (TOWEL DISPOSABLE) ×1 IMPLANT
TRAY FOLEY MTR SLVR 16FR STAT (SET/KITS/TRAYS/PACK) IMPLANT
TRAY LAPAROSCOPIC (CUSTOM PROCEDURE TRAY) ×1 IMPLANT
TROCAR ADV FIXATION 12X100MM (TROCAR) ×1 IMPLANT
TROCAR BALLN 12MMX100 BLUNT (TROCAR) ×1 IMPLANT
TROCAR Z-THREAD OPTICAL 5X100M (TROCAR) ×1 IMPLANT

## 2022-01-29 NOTE — Anesthesia Procedure Notes (Signed)
Procedure Name: Intubation Date/Time: 01/29/2022 8:10 AM  Performed by: [place] Manus Rudd, CRNAPre-anesthesia Checklist: Patient identified, Emergency Drugs available, Suction available and Patient being monitored Patient Re-evaluated:Patient Re-evaluated prior to induction Oxygen Delivery Method: Circle system utilized Preoxygenation: Pre-oxygenation with 100% oxygen Induction Type: IV induction Ventilation: Mask ventilation without difficulty Laryngoscope Size: Mac and 4 Grade View: Grade I Tube type: Oral Tube size: 7.5 mm Number of attempts: 1 Airway Equipment and Method: Stylet and Oral airway Placement Confirmation: ETT inserted through vocal cords under direct vision, positive ETCO2 and breath sounds checked- equal and bilateral Secured at: 21 cm Tube secured with: Tape Dental Injury: Teeth and Oropharynx as per pre-operative assessment

## 2022-01-29 NOTE — Anesthesia Postprocedure Evaluation (Signed)
Anesthesia Post Note  Patient: Albert [name]. Ahonen  Procedure(s) Performed: LAPAROSCOPIC APPENDECTOMY REPAIR OF UMBILICAL HERNIA     Patient location during evaluation: PACU Anesthesia Type: General Level of consciousness: awake and alert, patient cooperative and oriented Pain management: pain level controlled Vital Signs Assessment: post-procedure vital signs reviewed and stable Respiratory status: spontaneous breathing, nonlabored ventilation and respiratory function stable Cardiovascular status: blood pressure returned to baseline and stable Postop Assessment: no apparent nausea or vomiting Anesthetic complications: no   No notable events documented.  Last Vitals:  Vitals:   01/29/22 0930 01/29/22 0945  BP: (!) 130/57 (!) 124/56  Pulse: 87 81  Resp: 13 14  Temp:    SpO2: 100% 100%    Last Pain:  Vitals:   01/29/22 0930  TempSrc:   PainSc: 0-No pain                 Judi Jaffe,E. Wojciech Willetts

## 2022-01-29 NOTE — Op Note (Signed)
Laparoscopic Appendectomy/ Repair of Umbilical hernia Procedure Note  Indications: This is a 71 year old male who is on chronic anticoagulation for recurrent pulmonary emboli who presents with a recent history of several weeks of persistent right lower quadrant abdominal pain.  Initially this was felt to represent a muscle strain.  The patient is in the process of moving to a new home.  However, with the persistence of the pain, he underwent CT scan of the abdomen and pelvis.  This shows some thickening of the distal appendix with no sign of inflammation.  The patient states that he had a colonoscopy in November 2022 that showed some diverticulosis but no masses at the appendiceal orifice.  We will try to obtain those records from [facility].  Incidentally, the patient was noted to have a left inguinal hernia but he is completely asymptomatic.  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  After discussion, we decided to proceed with laparoscopic appendectomy and repair of the umbilical hernia  Pre-operative Diagnosis: RLQ tenderness/ abnormal appearing appendix/ umbilical hernia  Post-operative Diagnosis: Same  Surgeon: Maia Petties   Assistants: none  Anesthesia: General endotracheal anesthesia  ASA Class: 2  Procedure Details  The patient was seen again in the Holding Room. The risks, benefits, complications, treatment options, and expected outcomes were discussed with the patient and/or family. The possibilities of reaction to medication, perforation of viscus, bleeding, recurrent infection, finding a normal appendix, the need for additional procedures, failure to diagnose a condition, and creating a complication requiring transfusion or operation were discussed. There was concurrence with the proposed plan and informed consent was obtained. The site of surgery was properly noted. The patient was taken to Operating Room, identified as Odie Edmonds. Nikolic and the procedure verified as Appendectomy. A  Time Out was held and the above information confirmed.  The patient was placed in the supine position and general anesthesia was induced.  The abdomen was prepped and draped in a sterile fashion. A one centimeter supraumbilical incision was made.  We dissected around the hernia sac down to the fascia.  The fascial defect is 6 mm in diameter.  We enlarged the fascial opening to 1 cm and amputated the hernia sac.  We entered the peritoneal cavity bluntly.  A pursestring suture was passed around the fascial opening with a 0 Vicryl.  The Hasson cannula was introduced into the abdomen and the tails of the suture were used to hold the Hasson in place.   The pneumoperitoneum was then established maintaining a maximum pressure of 15 mmHg.  Additional 5 mm cannulas then placed in the left lower quadrant of the abdomen and the epigastrium under direct visualization. A careful evaluation of the entire abdomen was carried out. The patient was placed in Trendelenburg and left lateral decubitus position.  The scope was moved to the right upper quadrant port site. The cecum was mobilized medially.  The appendix is tortuous but there are no obvious signs of inflammation.  The proximal appendix is mildly thickened but the base is normal. The appendix was carefully dissected. The appendix was skeletonized with the harmonic scalpel.   The appendix was divided at its base using an endo-GIA stapler. There was no evidence of bleeding, leakage, or complication after division of the appendix. Irrigation was also performed and irrigate suctioned from the abdomen as well.  A small whitish nodule about 1 cm in diameter was noted on the adjacent omentum.  This was taken with the harmonic scalpel and sent  for pathology.  The umbilical port site was closed with the purse string suture. There was no residual palpable fascial defect.  The trocar site skin wounds were closed with 4-0 Monocryl.  Instrument, sponge, and needle counts were  correct at the conclusion of the case.   Findings: The appendix was found to be mildly thickened, but not inflamed. There were not signs of necrosis.  There was not perforation. There was not abscess formation.  Estimated Blood Loss:  Minimal         Drains: none         Specimens: appendix and omental nodule         Complications:  None; patient tolerated the procedure well.         Disposition: PACU - hemodynamically stable.         Condition: stable  Imogene Burn. Georgette Dover, MD, [facility] Surgery  General Surgery   01/29/2022 9:15 AM

## 2022-01-29 NOTE — Transfer of Care (Signed)
Immediate Anesthesia Transfer of Care Note  Patient: Albert [name]. [name]  Procedure(s) Performed: LAPAROSCOPIC APPENDECTOMY REPAIR OF UMBILICAL HERNIA  Patient Location: PACU  Anesthesia Type:General  Level of Consciousness: awake, alert  and oriented  Airway & Oxygen Therapy: Patient Spontanous Breathing and Patient connected to face mask oxygen  Post-op Assessment: Report given to RN and Post -op Vital signs reviewed and stable  Post vital signs: Reviewed and stable  Last Vitals:  Vitals Value Taken Time  BP 146/85 01/29/22 0915  Temp    Pulse 85 01/29/22 0916  Resp 12 01/29/22 0916  SpO2 97 % 01/29/22 0916  Vitals shown include unvalidated device data.  Last Pain:  Vitals:   01/29/22 0630  TempSrc:   PainSc: 0-No pain         Complications: No notable events documented.

## 2022-01-29 NOTE — Interval H&P Note (Signed)
History and Physical Interval Note:  01/29/2022 7:47 AM  Albert [name]  has presented today for surgery, with the diagnosis of CHRONIC APPENDICITIS, UMBILICAL HERNIA.  The various methods of treatment have been discussed with the patient and family. After consideration of risks, benefits and other options for treatment, the patient has consented to  Procedure(s): LAPAROSCOPIC APPENDECTOMY (N/A) REPAIR OF UMBILICAL HERNIA (N/A) as a surgical intervention.  The patient's history has been reviewed, patient examined, no change in status, stable for surgery.  I have reviewed the patient's chart and labs.  Questions were answered to the patient's satisfaction.    Symptoms are improved since the office visit.  Maia Petties

## 2022-01-29 NOTE — Discharge Instructions (Signed)
[facility] ______[facility] LAPAROSCOPIC SURGERY: POST OP INSTRUCTIONS Always review your discharge instruction sheet given to you by the facility where your surgery was performed. IF YOU HAVE DISABILITY OR FAMILY LEAVE FORMS, YOU MUST BRING THEM TO THE OFFICE FOR PROCESSING.   DO NOT GIVE THEM TO YOUR DOCTOR.  A prescription for pain medication may be given to you upon discharge.  Take your pain medication as prescribed, if needed.  If narcotic pain medicine is not needed, then you may take acetaminophen (Tylenol) or ibuprofen (Advil) as needed. Take your usually prescribed medications unless otherwise directed. If you need a refill on your pain medication, please contact your pharmacy.  They will contact our office to request authorization. Prescriptions will not be filled after 5pm or on week-ends. You should follow a light diet the first few days after arrival home, such as soup and crackers, etc.  Be sure to include lots of fluids daily. Most patients will experience some swelling and bruising in the area of the incisions.  Ice packs will help.  Swelling and bruising can take several days to resolve.  It is common to experience some constipation if taking pain medication after surgery.  Increasing fluid intake and taking a stool softener (such as Colace) will usually help or prevent this problem from occurring.  A mild laxative (Milk of Magnesia or Miralax) should be taken according to package instructions if there are no bowel movements after 48 hours. Unless discharge instructions indicate otherwise, you may remove your bandages 24-48 hours after surgery, and you may shower at that time.  You may have steri-strips (small skin tapes) in place directly over the incision.  These strips should be left on the skin for 7-10 days.  If your surgeon used skin glue on the incision, you may shower in 24 hours.  The glue will flake off over the next 2-3 weeks.  Any sutures or staples will be  removed at the office during your follow-up visit. ACTIVITIES:  You may resume regular (light) daily activities beginning the next day--such as daily self-care, walking, climbing stairs--gradually increasing activities as tolerated.  You may have sexual intercourse when it is comfortable.  Refrain from any heavy lifting or straining until approved by your doctor. You may drive when you are no longer taking prescription pain medication, you can comfortably wear a seatbelt, and you can safely maneuver your car and apply brakes. RETURN TO WORK:  __________________________________________________________ You should see your doctor in the office for a follow-up appointment approximately 2-3 weeks after your surgery.  Make sure that you call for this appointment within a day or two after you arrive home to insure a convenient appointment time. OTHER INSTRUCTIONS: __________________________________________________________________________________________________________________________ __________________________________________________________________________________________________________________________ WHEN TO CALL YOUR DOCTOR: Fever over 101.0 Inability to urinate Continued bleeding from incision. Increased pain, redness, or drainage from the incision. Increasing abdominal pain  The clinic staff is available to answer your questions during regular business hours.  Please don't hesitate to call and ask to speak to one of the nurses for clinical concerns.  If you have a medical emergency, go to the nearest emergency room or call 911.  A surgeon from [facility] is always on call at the hospital. [address], [address], [place], [place]  [address] ? [address] [address], [place],    [address] [phone] ? [phone] ? FAX [phone] Web site: [email or web address]  

## 2022-01-30 ENCOUNTER — Encounter (HOSPITAL_COMMUNITY): Payer: Self-pay | Admitting: Surgery

## 2022-01-30 LAB — SURGICAL PATHOLOGY

## 2022-02-12 DIAGNOSIS — M4326 Fusion of spine, lumbar region: Secondary | ICD-10-CM | POA: Diagnosis not present

## 2022-02-12 DIAGNOSIS — M546 Pain in thoracic spine: Secondary | ICD-10-CM | POA: Diagnosis not present

## 2022-02-12 DIAGNOSIS — M8008XA Age-related osteoporosis with current pathological fracture, vertebra(e), initial encounter for fracture: Secondary | ICD-10-CM | POA: Diagnosis not present

## 2022-02-15 ENCOUNTER — Emergency Department (HOSPITAL_BASED_OUTPATIENT_CLINIC_OR_DEPARTMENT_OTHER)
Admission: EM | Admit: 2022-02-15 | Discharge: 2022-02-15 | Disposition: A | Discharge status: Home / Self Care | Payer: PPO | Attending: Emergency Medicine | Admitting: Emergency Medicine

## 2022-02-15 ENCOUNTER — Other Ambulatory Visit: Payer: Self-pay

## 2022-02-15 ENCOUNTER — Encounter (HOSPITAL_BASED_OUTPATIENT_CLINIC_OR_DEPARTMENT_OTHER): Payer: Self-pay | Admitting: Emergency Medicine

## 2022-02-15 DIAGNOSIS — M545 Low back pain, unspecified: Secondary | ICD-10-CM | POA: Diagnosis not present

## 2022-02-15 DIAGNOSIS — G8929 Other chronic pain: Secondary | ICD-10-CM | POA: Insufficient documentation

## 2022-02-15 DIAGNOSIS — M791 Myalgia, unspecified site: Secondary | ICD-10-CM | POA: Insufficient documentation

## 2022-02-15 DIAGNOSIS — R109 Unspecified abdominal pain: Secondary | ICD-10-CM | POA: Diagnosis not present

## 2022-02-15 DIAGNOSIS — M546 Pain in thoracic spine: Secondary | ICD-10-CM | POA: Diagnosis not present

## 2022-02-15 DIAGNOSIS — M549 Dorsalgia, unspecified: Secondary | ICD-10-CM

## 2022-02-15 NOTE — ED Provider Notes (Signed)
[facility] EMERGENCY DEPARTMENT Provider Note   CSN: [id number] Arrival date & time: 02/15/22  1135     History Chief Complaint  Patient presents with   Abdominal Pain   Back Pain    HPI Franciso Dierks. Zaremba is a 71 y.o. male presenting for acute on chronic back pain.  He states that he was seen earlier this week by his spinal doctor after he had a popping sensation in his back.  He has a history of discectomy in the past.  He denies fevers or chills nausea vomiting syncope shortness of breath.  Is otherwise ambulatory tolerating p.o. intake on arrival.  No known sick contacts.  Unfortunately patient had an hour and a half long wait prior to being seen states that his symptoms have completely resolved at this time.  He states that his pain medications are now working and he would like to be discharged.  He has an MRI scheduled for Tuesday of this week.  Denies incontinence, perineal numbness or any other acute changes in the symptoms.  He states he only reason he came in is because his insurance company told him to get checked out today due to his ongoing pain.   Patient's recorded medical, surgical, social, medication list and allergies were reviewed in the Snapshot window as part of the initial history.   Review of Systems   Review of Systems  Constitutional:  Negative for chills and fever.  HENT:  Negative for ear pain and sore throat.   Eyes:  Negative for pain and visual disturbance.  Respiratory:  Negative for cough and shortness of breath.   Cardiovascular:  Negative for chest pain and palpitations.  Gastrointestinal:  Negative for abdominal pain and vomiting.  Genitourinary:  Negative for dysuria and hematuria.  Musculoskeletal:  Positive for back pain. Negative for arthralgias.  Skin:  Negative for color change and rash.  Neurological:  Negative for seizures and syncope.  All other systems reviewed and are negative.   Physical Exam Updated Vital Signs BP (!)  143/86 (BP Location: Left Arm)   Pulse 75   Temp 98.4 F (36.9 C) (Oral)   Resp 18   Ht '5\' 10"'$  (1.778 m)   Wt 81.6 kg   SpO2 99%   BMI 25.83 kg/m  Physical Exam Vitals and nursing note reviewed.  Constitutional:      General: He is not in acute distress.    Appearance: He is well-developed.  HENT:     Head: Normocephalic and atraumatic.  Eyes:     Conjunctiva/sclera: Conjunctivae normal.  Cardiovascular:     Rate and Rhythm: Normal rate and regular rhythm.     Heart sounds: No murmur heard. Pulmonary:     Effort: Pulmonary effort is normal. No respiratory distress.     Breath sounds: Normal breath sounds.  Abdominal:     Palpations: Abdomen is soft.     Tenderness: There is no abdominal tenderness.  Musculoskeletal:        General: No swelling.     Cervical back: Neck supple.  Skin:    General: Skin is warm and dry.     Capillary Refill: Capillary refill takes less than 2 seconds.  Neurological:     General: No focal deficit present.     Mental Status: He is alert and oriented to person, place, and time.     Cranial Nerves: No cranial nerve deficit.     Motor: No weakness.  Psychiatric:  Mood and Affect: Mood normal.      ED Course/ Medical Decision Making/ A&P    Procedures Procedures   Medications Ordered in ED Medications - No data to display Medical Decision Making:   Tobie Hellen. Dimaio is a 71 y.o. male who presented to the ED today with acute lower back pain over the past 96 hours, detailed above.    Patient placed on continuous vitals and telemetry monitoring while in ED which was reviewed periodically.   On my initial exam, the pt was with an intact neurologic exam, tolerating ambulation with an antalgic gait and p.o. intake without difficulty.  Patient had no abnormal DTRs, no midline spinal tenderness.  Patient endorsing complete sensation of the perineum.  Patient without episodes of fecal or urinary incontinence.  Patient has no focal  neurologic deficits and reassuring vital signs at this time.  No obvious physical abnormality or injury on exam. Notably, patient denies recent trauma, is afebrile, and denies IVDU.  Reviewed and confirmed nursing documentation for past medical history, family history, social history.    Initial Assessment:   With the patient's presentation of acute back pain in the above setting, most likely diagnosis is musculoskeletal strain. Other diagnoses were considered including (but not limited to) underlying fracture, epidural hematoma, cauda equina syndrome, spinal stenosis, spinal malignancy. These are considered less likely due to history of present illness and physical exam findings.   In particular, lack of fever, substantial history of IV drug use, or substantial neurologic abnormality is less consistent with epidural abscess versus discitis or other spinal infection. In particular,  Initial Plan:  Multimodal pain control described and patient informed on safe usage.  Patient stable for continued outpatient evaluation and management of their musculoskeletal pains.  Patient referred back to primary care provider for continued evaluation and management.   Disposition:   Based on the above findings, I believe patient is stable for discharge.    Patient and family educated about specific return precautions for given chief complaint and symptoms.  Patient and family educated about follow-up with PCP and spine center.  Patient and family expressed understanding of return precautions and need for follow-up. Patient spoken to regarding all imaging and laboratory results and appropriate follow up for these results. All education provided in verbal and written form and time was allowed for answering of patient questions. Patient discharged.    Emergency Department Medication Summary:   Medications - No data to display   Clinical Impression:  1. Acute midline back pain, unspecified back location       Discharge   Final Clinical Impression(s) / ED Diagnoses Final diagnoses:  Acute midline back pain, unspecified back location    Rx / DC Orders ED Discharge Orders     None         Tretha Sciara, MD 02/15/22 1312

## 2022-02-15 NOTE — ED Triage Notes (Signed)
Pt states felt a pop in his back saw a dr and is to have MRI  but it is a week away but now he feels like he is weaker had an xray but thinks they saw an old fx

## 2022-02-15 NOTE — ED Triage Notes (Signed)
Pt c/o mid back pain that has radiated to left side of abdomen. Pt has history of possible cracked disk to mid spine. Pt also reports weakness to bilateral legs.

## 2022-02-16 ENCOUNTER — Encounter: Payer: Self-pay | Admitting: Cardiology

## 2022-02-16 NOTE — Telephone Encounter (Signed)
Patient stated he injured his mid back and went to ED yesterday. He is having MRI tomorrow. He stated that 4 hours after d/c from the ED, he has had "PVCs" around the clock. He denies dizziness, sob, or lightheadedness. BP today 102/64 - 139/69, P 75. He takes Toprol '50mg'$  daily and Lopressor '25mg'$  twice daily prn for breakthrough PVCs. They have not helped with his PVCs. Please advise.

## 2022-02-16 NOTE — Telephone Encounter (Signed)
I would imagine the PVCs are not driven by his back pain.  As the back pain improves, this should also get better.  It may take a day or so of taking an extra dose of beta-blocker to calm them down.  I suspect that the pain is controlled, PVCs will improve.  Glenetta Hew, MD

## 2022-02-16 NOTE — Telephone Encounter (Signed)
Patient is requesting a callback regarding this instead of a mychart message.

## 2022-02-17 ENCOUNTER — Encounter: Payer: Self-pay | Admitting: Hematology and Oncology

## 2022-02-17 DIAGNOSIS — S22050D Wedge compression fracture of T5-T6 vertebra, subsequent encounter for fracture with routine healing: Secondary | ICD-10-CM | POA: Diagnosis not present

## 2022-02-17 DIAGNOSIS — M47814 Spondylosis without myelopathy or radiculopathy, thoracic region: Secondary | ICD-10-CM | POA: Diagnosis not present

## 2022-02-17 DIAGNOSIS — Z8781 Personal history of (healed) traumatic fracture: Secondary | ICD-10-CM | POA: Diagnosis not present

## 2022-02-17 DIAGNOSIS — M546 Pain in thoracic spine: Secondary | ICD-10-CM | POA: Diagnosis not present

## 2022-02-17 NOTE — Telephone Encounter (Signed)
Spoke with patient to ensure he understood the Mychart message of taking metoprolol tartrate as needed for PVCs. He verbalized understanding.

## 2022-02-17 NOTE — Telephone Encounter (Signed)
See phone note 02/17/22. I spoke with patient.

## 2022-02-19 DIAGNOSIS — S22050A Wedge compression fracture of T5-T6 vertebra, initial encounter for closed fracture: Secondary | ICD-10-CM | POA: Diagnosis not present

## 2022-02-19 DIAGNOSIS — M8008XA Age-related osteoporosis with current pathological fracture, vertebra(e), initial encounter for fracture: Secondary | ICD-10-CM | POA: Diagnosis not present

## 2022-02-20 ENCOUNTER — Encounter: Payer: Self-pay | Admitting: Hematology and Oncology

## 2022-02-22 ENCOUNTER — Encounter: Payer: Self-pay | Admitting: Hematology and Oncology

## 2022-02-23 ENCOUNTER — Encounter: Payer: Self-pay | Admitting: Hematology and Oncology

## 2022-02-26 DIAGNOSIS — S22050A Wedge compression fracture of T5-T6 vertebra, initial encounter for closed fracture: Secondary | ICD-10-CM | POA: Diagnosis not present

## 2022-02-26 DIAGNOSIS — M8008XA Age-related osteoporosis with current pathological fracture, vertebra(e), initial encounter for fracture: Secondary | ICD-10-CM | POA: Diagnosis not present

## 2022-03-02 ENCOUNTER — Encounter: Payer: Self-pay | Admitting: Hematology and Oncology

## 2022-03-02 ENCOUNTER — Other Ambulatory Visit: Payer: Self-pay

## 2022-03-02 MED ORDER — RIVAROXABAN 20 MG PO TABS: 20.0000 mg | ORAL_TABLET | Freq: Every day | ORAL | 3 refills | Status: DC

## 2022-03-13 DIAGNOSIS — M8008XA Age-related osteoporosis with current pathological fracture, vertebra(e), initial encounter for fracture: Secondary | ICD-10-CM | POA: Diagnosis not present

## 2022-03-13 DIAGNOSIS — S22050A Wedge compression fracture of T5-T6 vertebra, initial encounter for closed fracture: Secondary | ICD-10-CM | POA: Diagnosis not present

## 2022-03-13 DIAGNOSIS — I10 Essential (primary) hypertension: Secondary | ICD-10-CM | POA: Diagnosis not present

## 2022-03-13 DIAGNOSIS — Z6826 Body mass index (BMI) 26.0-26.9, adult: Secondary | ICD-10-CM | POA: Diagnosis not present

## 2022-03-18 DIAGNOSIS — D225 Melanocytic nevi of trunk: Secondary | ICD-10-CM | POA: Diagnosis not present

## 2022-03-18 DIAGNOSIS — L578 Other skin changes due to chronic exposure to nonionizing radiation: Secondary | ICD-10-CM | POA: Diagnosis not present

## 2022-03-18 DIAGNOSIS — L570 Actinic keratosis: Secondary | ICD-10-CM | POA: Diagnosis not present

## 2022-03-18 DIAGNOSIS — L821 Other seborrheic keratosis: Secondary | ICD-10-CM | POA: Diagnosis not present

## 2022-04-20 DIAGNOSIS — Z23 Encounter for immunization: Secondary | ICD-10-CM | POA: Diagnosis not present

## 2022-06-02 ENCOUNTER — Encounter: Payer: Self-pay | Admitting: Hematology and Oncology

## 2022-06-08 ENCOUNTER — Encounter: Payer: Self-pay | Admitting: Hematology and Oncology

## 2022-06-09 ENCOUNTER — Other Ambulatory Visit: Payer: Self-pay

## 2022-06-09 ENCOUNTER — Telehealth: Payer: Self-pay | Admitting: Hematology and Oncology

## 2022-06-09 MED ORDER — RIVAROXABAN 20 MG PO TABS: 20.0000 mg | ORAL_TABLET | Freq: Every day | ORAL | 3 refills | Status: DC

## 2022-06-09 NOTE — Telephone Encounter (Signed)
Rescheduled appointment per patients request via telephone due to insurance. Patient is aware of the changes made to his upcoming appointment.

## 2022-06-11 ENCOUNTER — Encounter: Payer: Self-pay | Admitting: Hematology and Oncology

## 2022-07-06 ENCOUNTER — Telehealth: Payer: PPO | Admitting: Hematology and Oncology

## 2022-07-14 ENCOUNTER — Inpatient Hospital Stay: Payer: PPO | Attending: Hematology and Oncology | Admitting: Hematology and Oncology

## 2022-07-14 DIAGNOSIS — I82409 Acute embolism and thrombosis of unspecified deep veins of unspecified lower extremity: Secondary | ICD-10-CM

## 2022-07-14 DIAGNOSIS — Z86718 Personal history of other venous thrombosis and embolism: Secondary | ICD-10-CM | POA: Insufficient documentation

## 2022-07-14 DIAGNOSIS — Z7901 Long term (current) use of anticoagulants: Secondary | ICD-10-CM | POA: Insufficient documentation

## 2022-07-14 NOTE — Assessment & Plan Note (Signed)
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 He does not smoke cigarettes and stays very active and he is not overweight.  There is no family history of blood clots either. Patient has retired from [employer].   Patient moved from [place] to [place] 05/22/2019.  This is to be closer to their grandchildren.   Current treatment: Coumadin switched to Xarelto 07/03/2019 (lupus anticoagulant negative): Lifelong anticoagulation    Xarelto toxicities: None He is able to afford Xarelto.  When he goes in the donut hole he applies for manufacturer assistance and he is getting it.   Return to clinic on an as-needed basis

## 2022-07-14 NOTE — Progress Notes (Signed)
  HEMATOLOGY-ONCOLOGY [facility] VISIT PROGRESS NOTE  I connected with Albert [name] on 07/14/2022 at  1:45 PM EST by Mychart video conference and verified that I am speaking with the correct person using two identifiers.  I discussed the limitations, risks, security and privacy concerns of performing an evaluation and management service by Webex and the availability of in person appointments.  I also discussed with the patient that there may be a patient responsible charge related to this service. The patient expressed understanding and agreed to proceed.  Patient's Location: Home Physician Location: Clinic  CHIEF COMPLIANT: Follow-up to discuss tolerance to Xarelto  INTERVAL HISTORY: Albert [name] is a 72 y.o. male with above-mentioned history of recurrent blood clots who is currently on long-term anticoagulation with Xarelto.  He is tolerating it extremely well without any problems with bruising or bleeding.  REVIEW OF SYSTEMS:   Constitutional: Denies fevers, chills or abnormal weight loss All other systems were reviewed with the patient and are negative.  Observations/Objective:  There were no vitals filed for this visit. There is no height or weight on file to calculate BMI.  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 He does not smoke cigarettes and stays very active and he is not overweight.  There is no family history of blood clots either. Patient has retired from [employer].   Patient moved from [place] to [place] 05/22/2019.  This is to be closer to their grandchildren.   Current treatment: Coumadin switched to Xarelto 07/03/2019 (lupus anticoagulant negative): Lifelong anticoagulation    Xarelto toxicities: None He is able to afford Xarelto.  When he goes in the donut hole he applies for manufacturer assistance and he is getting it. Plans to take Ocuvite to prevent cataracts   Return to clinic on an as-needed basis  I discussed the assessment and treatment plan with the patient. The patient was provided an opportunity to ask questions and all were answered. The patient agreed with the plan and demonstrated an understanding of the instructions. The patient was advised to call back or seek an in-person evaluation if the symptoms worsen or if the condition fails to improve as anticipated.   I provided 20 minutes of face-to-face Web Ex time during this encounter.    Rulon Eisenmenger, MD 07/14/2022

## 2022-07-28 ENCOUNTER — Encounter: Payer: Self-pay | Admitting: Cardiology

## 2022-07-31 DIAGNOSIS — Z86711 Personal history of pulmonary embolism: Secondary | ICD-10-CM | POA: Diagnosis not present

## 2022-07-31 DIAGNOSIS — M21621 Bunionette of right foot: Secondary | ICD-10-CM | POA: Diagnosis not present

## 2022-07-31 DIAGNOSIS — Z0000 Encounter for general adult medical examination without abnormal findings: Secondary | ICD-10-CM | POA: Diagnosis not present

## 2022-07-31 DIAGNOSIS — G4700 Insomnia, unspecified: Secondary | ICD-10-CM | POA: Diagnosis not present

## 2022-07-31 DIAGNOSIS — Z87898 Personal history of other specified conditions: Secondary | ICD-10-CM | POA: Diagnosis not present

## 2022-07-31 DIAGNOSIS — D6869 Other thrombophilia: Secondary | ICD-10-CM | POA: Diagnosis not present

## 2022-07-31 DIAGNOSIS — I493 Ventricular premature depolarization: Secondary | ICD-10-CM | POA: Diagnosis not present

## 2022-07-31 DIAGNOSIS — M792 Neuralgia and neuritis, unspecified: Secondary | ICD-10-CM | POA: Diagnosis not present

## 2022-07-31 DIAGNOSIS — Z125 Encounter for screening for malignant neoplasm of prostate: Secondary | ICD-10-CM | POA: Diagnosis not present

## 2022-07-31 DIAGNOSIS — E7800 Pure hypercholesterolemia, unspecified: Secondary | ICD-10-CM | POA: Diagnosis not present

## 2022-07-31 DIAGNOSIS — M21622 Bunionette of left foot: Secondary | ICD-10-CM | POA: Diagnosis not present

## 2022-07-31 DIAGNOSIS — I872 Venous insufficiency (chronic) (peripheral): Secondary | ICD-10-CM | POA: Diagnosis not present

## 2022-07-31 DIAGNOSIS — D2371 Other benign neoplasm of skin of right lower limb, including hip: Secondary | ICD-10-CM | POA: Diagnosis not present

## 2022-07-31 DIAGNOSIS — L84 Corns and callosities: Secondary | ICD-10-CM | POA: Diagnosis not present

## 2022-07-31 DIAGNOSIS — I10 Essential (primary) hypertension: Secondary | ICD-10-CM | POA: Diagnosis not present

## 2022-07-31 DIAGNOSIS — I341 Nonrheumatic mitral (valve) prolapse: Secondary | ICD-10-CM | POA: Diagnosis not present

## 2022-07-31 DIAGNOSIS — L565 Disseminated superficial actinic porokeratosis (DSAP): Secondary | ICD-10-CM | POA: Diagnosis not present

## 2022-07-31 DIAGNOSIS — Z1331 Encounter for screening for depression: Secondary | ICD-10-CM | POA: Diagnosis not present

## 2022-08-01 LAB — LAB REPORT - SCANNED: EGFR: 93

## 2022-08-12 NOTE — Progress Notes (Signed)
Cardiology Clinic Note   Date: 08/14/2022 ID: Albert [name]. Krisean, Craver 1951/02/01, MRN [id number]  Primary Cardiologist:  Glenetta Hew, MD  Patient Profile    Albert [name]. Levison is a 72 y.o. male who presents to the clinic today for 1 year follow-up of chronic cardiac conditions.  Past medical history significant for: 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  Treated with Coumadin for 1 year. 2007: DVT right leg.  Treated with Coumadin for 1 year. 2014: DVT and PE.  Now on lifelong anticoagulation.  (Switched from Coumadin to Xarelto January 2021). Followed by heme-onc. Mitral valve prolapse. Echo 07/17/2020: EF 60 to 65%.  Mild to moderate mitral valve regurgitation.  No mitral valve stenosis.  Mild late systolic prolapse of the middle scallop of the posterior leaflet of the mitral valve.  Mild aortic valve sclerosis without stenosis. Hypertension. Hyperlipidemia. Lipid panel 07/31/2022: LDL 117, HDL 52, TG 112, total 189. 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   History of Present Illness    Quillian Varble. Alen was first evaluated by Dr. Yvone Neu in [place] on 03/31/2016 for mitral valve prolapse with complaints of occasional palpitations. Echo showed EF 60-65%, grade I DD, mild MR. He was then evaluated by Dr. Ellyn Hack on 06/30/2019 after moving back to the area from [place] for mitral valve prolapse at the request of Dr. Moreen Fowler, PCP.  Last seen in the office by Dr. Ellyn Hack on 07/14/2021 for routine follow-up.  He was doing well at that time and no medication changes were made.  Today, patient is accompanied by his wife.  He is doing very well with no complaints. Patient denies shortness of breath or dyspnea on exertion. No chest pain, pressure, or tightness. Denies lower extremity edema, orthopnea, or PND. No palpitations.  Denies claudication.  He is going to be evaluated by vascular for his past history of DVT and PEs at his PCP's request.  He has not had a recurrence  since 2014.  His Xarelto is followed by heme-onc.  He denies blood in stool or urine or any other bleeding issues.  He stays active around his home doing heavy household chores including vacuuming, laundry, dishes.  He walks his dog 5-6 times a day and recently started a walking program with his wife.    ROS: All other systems reviewed and are otherwise negative except as noted in History of Present Illness.  Studies Reviewed    ECG personally reviewed by me today: Sinus rhythm with occasional PVCs, rate 75 bpm.  No significant changes from 01/30/2021.   Physical Exam    VS:  BP 112/78   Pulse 75   Ht '5\' 10"'$  (1.778 m)   Wt 192 lb 12.8 oz (87.5 kg)   SpO2 95%   BMI 27.66 kg/m  , BMI Body mass index is 27.66 kg/m.  GEN: Well nourished, well developed, in no acute distress. Neck: No JVD or carotid bruits. Cardiac: RRR. No murmurs. No rubs or gallops.   Respiratory:  Respirations regular and unlabored. Clear to auscultation without rales, wheezing or rhonchi. GI: Soft, nontender, nondistended. Extremities: Radials/DP/PT 2+ and equal bilaterally. No clubbing or cyanosis. No edema.  Skin: Warm and dry, no rash. Neuro: Strength intact.  Assessment & Plan   Mitral valve prolapse.  Echo February 2022 with EF 60 to 65%.  Mild late systolic prolapse of the middle scallop of the posterior leaflet of the mitral valve, mild to moderate MR.  Patient denies shortness of breath, DOE,  lower extremity edema.  He is very active walking his dog 5-6 times a day and just recently started a walking program of his wife without difficulty.  Will order repeat echo. Frequent PVCs.  Patient denies palpitations.  He states very occasionally he may feel a little skipped but it is not bothersome to him.  Continue Toprol daily and lopressor prn for breakthrough.  Hypertension.  BP today 112/78.  Patient denies headaches or dizziness.  Continue Toprol. Hyperlipidemia.  LDL very 2024 117, not at goal.  Start  atorvastatin 20 mg. Recurrent DVT and PE/hypercoagulable state.  Episodes dating back to 2005.  Last DVT/PE in 2014.  On lifelong anticoagulation.  Continue Xarelto managed by heme-onc.  Patient has an upcoming appointment with vascular at the request of his PCP.  Disposition: Start atorvastatin 20 mg.  Return in 10 weeks for repeat lipid panel and LFTs.  Follow-up in 1 year or sooner as needed.         Signed, Justice Britain. Shamyah Stantz, DNP, NP-C

## 2022-08-14 ENCOUNTER — Encounter: Payer: Self-pay | Admitting: Hematology and Oncology

## 2022-08-14 ENCOUNTER — Encounter: Payer: Self-pay | Admitting: Student

## 2022-08-14 ENCOUNTER — Ambulatory Visit: Payer: PPO | Attending: Student | Admitting: Student

## 2022-08-14 ENCOUNTER — Ambulatory Visit: Payer: PPO | Admitting: Student

## 2022-08-14 VITALS — BP 112/78 | HR 75 | Ht 70.0 in | Wt 192.8 lb

## 2022-08-14 DIAGNOSIS — D6859 Other primary thrombophilia: Secondary | ICD-10-CM

## 2022-08-14 DIAGNOSIS — I82409 Acute embolism and thrombosis of unspecified deep veins of unspecified lower extremity: Secondary | ICD-10-CM

## 2022-08-14 DIAGNOSIS — E785 Hyperlipidemia, unspecified: Secondary | ICD-10-CM

## 2022-08-14 DIAGNOSIS — I493 Ventricular premature depolarization: Secondary | ICD-10-CM

## 2022-08-14 DIAGNOSIS — I341 Nonrheumatic mitral (valve) prolapse: Secondary | ICD-10-CM | POA: Diagnosis not present

## 2022-08-14 MED ORDER — ATORVASTATIN CALCIUM 20 MG PO TABS: 20.0000 mg | ORAL_TABLET | Freq: Every day | ORAL | 3 refills | Status: DC

## 2022-08-14 NOTE — Patient Instructions (Addendum)
Medication Instructions:  Your physician has recommended you make the following change in your medication:  START: Atorvastatin '20mg'$  daily.  *If you need a refill on your cardiac medications before your next appointment, please call your pharmacy*   Lab Work: Your physician recommends that you return in 8-10 weeks to have the following labs drawn: Lipid panel and Lft's  If you have labs (blood work) drawn today and your tests are completely normal, you will receive your results only by: [place] (if you have MyChart) OR A paper copy in the mail If you have any lab test that is abnormal or we need to change your treatment, we will call you to review the results.   Testing/Procedures: Your physician has requested that you have an echocardiogram. Echocardiography is a painless test that uses sound waves to create images of your heart. It provides your doctor with information about the size and shape of your heart and how well your heart's chambers and valves are working. This procedure takes approximately one hour. There are no restrictions for this procedure. Please do NOT wear cologne, perfume, aftershave, or lotions (deodorant is allowed). Please arrive 15 minutes prior to your appointment time.    Follow-Up: At [facility], you and your health needs are our priority.  As part of our continuing mission to provide you with exceptional heart care, we have created designated Provider Care Teams.  These Care Teams include your primary Cardiologist (physician) and Advanced Practice Providers (APPs -  Physician Assistants and Nurse Practitioners) who all work together to provide you with the care you need, when you need it.  We recommend signing up for the patient portal called "MyChart".  Sign up information is provided on this After Visit Summary.  MyChart is used to connect with patients for Virtual Visits (Telemedicine).  Patients are able to view lab/test results, encounter  notes, upcoming appointments, etc.  Non-urgent messages can be sent to your provider as well.   To learn more about what you can do with MyChart, go to [email or web address].    Your next appointment:   1 year(s)  Provider:   Glenetta Hew, MD

## 2022-08-20 MED ORDER — ATORVASTATIN CALCIUM 20 MG PO TABS: 10.0000 mg | ORAL_TABLET | Freq: Every day | ORAL | 3 refills | Status: DC

## 2022-08-20 NOTE — Telephone Encounter (Signed)
Message sent to patient . Changed medication list for decrease dose.

## 2022-08-20 NOTE — Telephone Encounter (Signed)
I would try starting off taking a half a tablet a day.  Take it with a meal to see if that helps absorb it.  Not a very usual side effect of statins.  If he cannot tolerate that, then would probably switch over to Crestor 20 mg.  Glenetta Hew, MD

## 2022-08-21 DIAGNOSIS — I87393 Chronic venous hypertension (idiopathic) with other complications of bilateral lower extremity: Secondary | ICD-10-CM | POA: Diagnosis not present

## 2022-08-21 DIAGNOSIS — I10 Essential (primary) hypertension: Secondary | ICD-10-CM | POA: Diagnosis not present

## 2022-08-21 DIAGNOSIS — R600 Localized edema: Secondary | ICD-10-CM | POA: Diagnosis not present

## 2022-08-21 DIAGNOSIS — I872 Venous insufficiency (chronic) (peripheral): Secondary | ICD-10-CM | POA: Diagnosis not present

## 2022-09-01 DIAGNOSIS — J309 Allergic rhinitis, unspecified: Secondary | ICD-10-CM | POA: Diagnosis not present

## 2022-09-08 ENCOUNTER — Encounter: Payer: Self-pay | Admitting: Cardiology

## 2022-09-08 DIAGNOSIS — M21621 Bunionette of right foot: Secondary | ICD-10-CM | POA: Diagnosis not present

## 2022-09-08 DIAGNOSIS — M21622 Bunionette of left foot: Secondary | ICD-10-CM | POA: Diagnosis not present

## 2022-09-08 DIAGNOSIS — L851 Acquired keratosis [keratoderma] palmaris et plantaris: Secondary | ICD-10-CM | POA: Diagnosis not present

## 2022-09-08 DIAGNOSIS — M792 Neuralgia and neuritis, unspecified: Secondary | ICD-10-CM | POA: Diagnosis not present

## 2022-09-08 DIAGNOSIS — I872 Venous insufficiency (chronic) (peripheral): Secondary | ICD-10-CM | POA: Diagnosis not present

## 2022-09-08 NOTE — Telephone Encounter (Signed)
Try Rosuvastatin 10 mg --> take 1/2 tab daily x 2 weeks, then increase to full tab daily. DH  Stop Atorvastatin 20 mg Disp: Rosuvastatin 10mg  - 1 tab daily (as directed); Disp 30 tab, 11 refills --> if tolerated, can change to 90 d supply.  [place]

## 2022-09-09 MED ORDER — ROSUVASTATIN CALCIUM 10 MG PO TABS
10.0000 mg | ORAL_TABLET | Freq: Every day | ORAL | 3 refills | Status: DC
Start: 2022-09-09 — End: 2023-08-23

## 2022-09-15 ENCOUNTER — Ambulatory Visit (HOSPITAL_COMMUNITY): Payer: PPO | Attending: Student

## 2022-09-15 DIAGNOSIS — I341 Nonrheumatic mitral (valve) prolapse: Secondary | ICD-10-CM

## 2022-09-15 LAB — ECHOCARDIOGRAM COMPLETE
Area-P 1/2: 3.7 cm2
P 1/2 time: 509 msec
S' Lateral: 2.6 cm

## 2022-09-16 ENCOUNTER — Encounter: Payer: Self-pay | Admitting: Cardiology

## 2022-09-16 ENCOUNTER — Telehealth: Payer: Self-pay | Admitting: Student

## 2022-09-16 NOTE — Telephone Encounter (Signed)
Call and spoke with Albert [name]. He is aware of his echo results and gave a verbal understanding.

## 2022-09-16 NOTE — Telephone Encounter (Signed)
Patient returned call for his Echo results.  

## 2022-09-16 NOTE — Telephone Encounter (Signed)
Matter addressed by phone. See chart.

## 2022-09-24 IMAGING — CR DG ABDOMEN 2V
2 series · 2 of 2 positions shown · non-contrast
Comparison: None Available.

CLINICAL DATA: Right lower quadrant pain while sitting x1 week.

EXAM:
ABDOMEN - 2 VIEW

[w abdomen upright *]
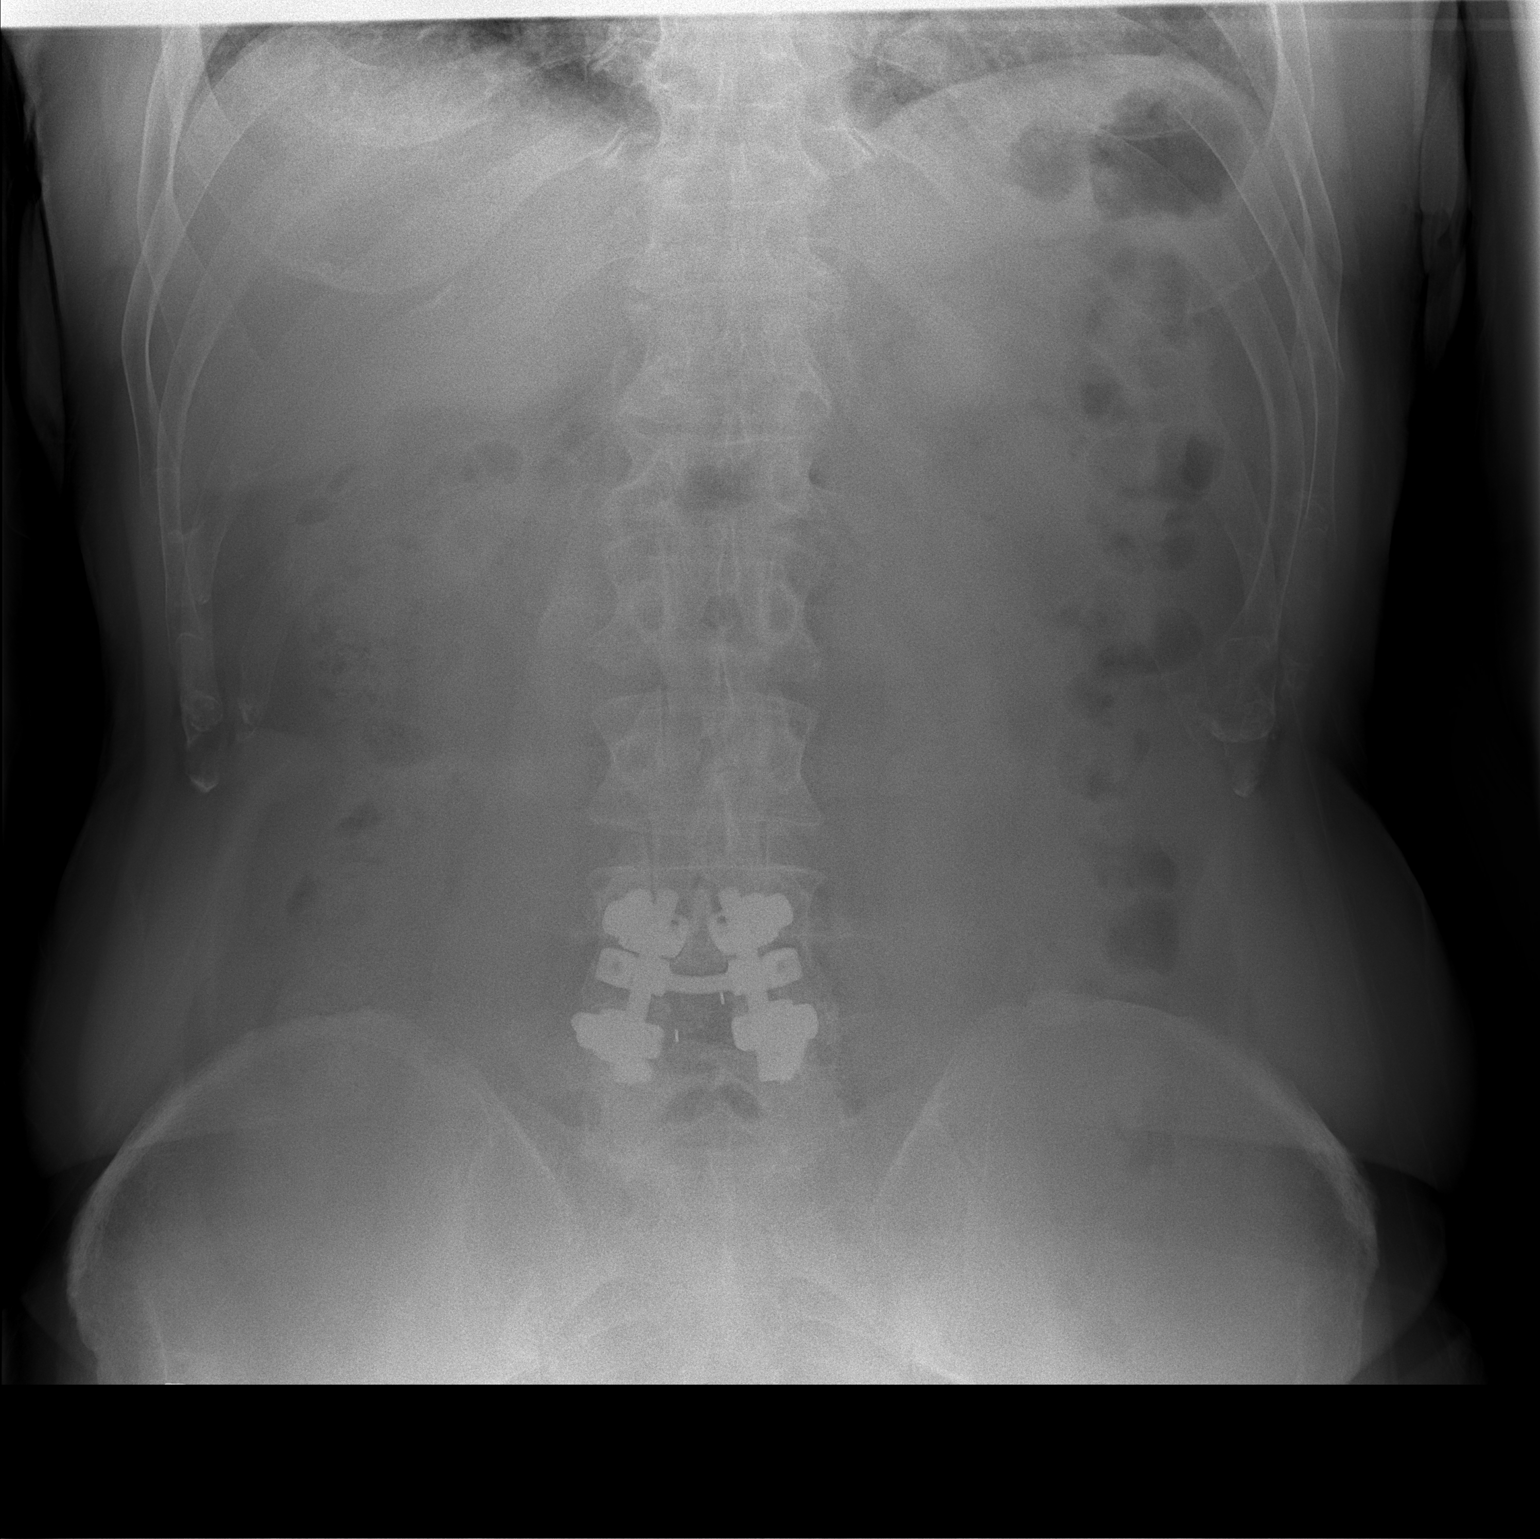

[t abdomen supine]
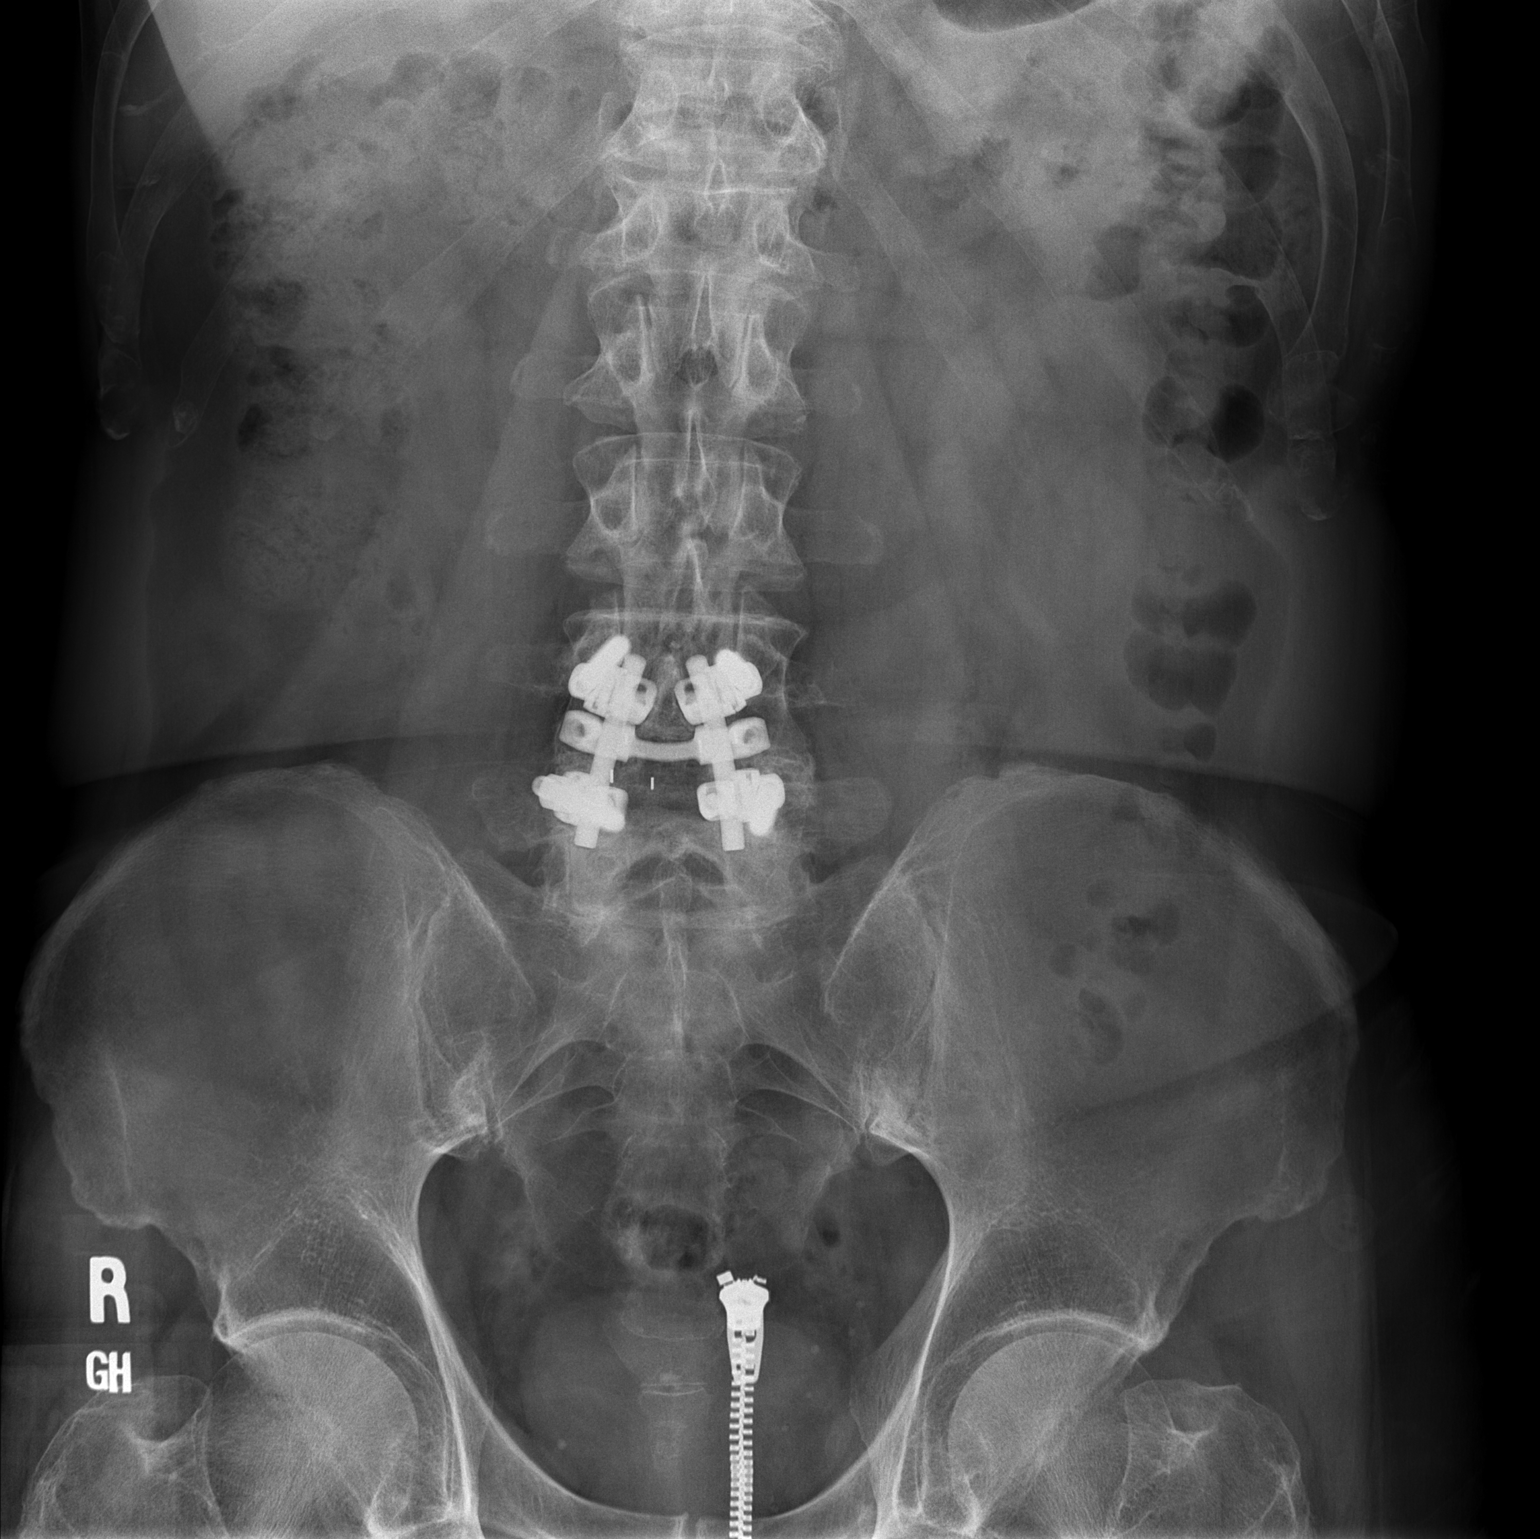

[2 of 2 positions shown; findings below may reference images not displayed]

FINDINGS: The bowel gas pattern is normal. There is no evidence of free air.
No radio-opaque calculi or other significant radiographic
abnormality is seen. Postoperative changes are seen within the lower
lumbar spine.
IMPRESSION: Negative.

## 2022-10-06 DIAGNOSIS — M21622 Bunionette of left foot: Secondary | ICD-10-CM | POA: Diagnosis not present

## 2022-10-06 DIAGNOSIS — L851 Acquired keratosis [keratoderma] palmaris et plantaris: Secondary | ICD-10-CM | POA: Diagnosis not present

## 2022-10-06 DIAGNOSIS — M792 Neuralgia and neuritis, unspecified: Secondary | ICD-10-CM | POA: Diagnosis not present

## 2022-10-06 DIAGNOSIS — I872 Venous insufficiency (chronic) (peripheral): Secondary | ICD-10-CM | POA: Diagnosis not present

## 2022-10-06 DIAGNOSIS — M21621 Bunionette of right foot: Secondary | ICD-10-CM | POA: Diagnosis not present

## 2022-10-07 DIAGNOSIS — L578 Other skin changes due to chronic exposure to nonionizing radiation: Secondary | ICD-10-CM | POA: Diagnosis not present

## 2022-10-07 DIAGNOSIS — L570 Actinic keratosis: Secondary | ICD-10-CM | POA: Diagnosis not present

## 2022-10-07 DIAGNOSIS — L821 Other seborrheic keratosis: Secondary | ICD-10-CM | POA: Diagnosis not present

## 2022-10-24 ENCOUNTER — Encounter: Payer: Self-pay | Admitting: Cardiology

## 2022-10-24 ENCOUNTER — Other Ambulatory Visit: Payer: Self-pay | Admitting: Cardiology

## 2022-11-11 ENCOUNTER — Encounter: Payer: Self-pay | Admitting: Hematology and Oncology

## 2022-11-11 DIAGNOSIS — M25562 Pain in left knee: Secondary | ICD-10-CM | POA: Diagnosis not present

## 2022-11-17 ENCOUNTER — Ambulatory Visit (INDEPENDENT_AMBULATORY_CARE_PROVIDER_SITE_OTHER): Payer: PPO

## 2022-11-17 ENCOUNTER — Ambulatory Visit: Payer: PPO | Admitting: Family Medicine

## 2022-11-17 VITALS — BP 148/84 | HR 83 | Ht 70.0 in | Wt 194.0 lb

## 2022-11-17 DIAGNOSIS — S22050A Wedge compression fracture of T5-T6 vertebra, initial encounter for closed fracture: Secondary | ICD-10-CM | POA: Insufficient documentation

## 2022-11-17 DIAGNOSIS — M25562 Pain in left knee: Secondary | ICD-10-CM

## 2022-11-17 DIAGNOSIS — M1712 Unilateral primary osteoarthritis, left knee: Secondary | ICD-10-CM | POA: Diagnosis not present

## 2022-11-17 DIAGNOSIS — G8929 Other chronic pain: Secondary | ICD-10-CM

## 2022-11-17 DIAGNOSIS — S22050S Wedge compression fracture of T5-T6 vertebra, sequela: Secondary | ICD-10-CM

## 2022-11-17 NOTE — Progress Notes (Signed)
Rubin Payor, PhD, LAT, ATC acting as a scribe for Clementeen Graham, MD.  Albert [name]. Albert [name] is a 72 y.o. male who presents to [facility] at [facility] today for L knee pain ongoing for about 2 wks. He doesn't recall a specific MOI, but is very active.  Patient locates pain to the anterior aspect of his L knee. He is traveling to [place] in 2 month and will be doing a lot of walking on uneven surfaces. Pain is mostly resolved. He notes some residual hamstring tightness.  L Knee swelling: no Mechanical symptoms: has resolved  Pertinent review of systems: No fevers or chills  Relevant historical information: Hypertension.  Recurrent DVT. History of lumbar and thoracic compression fractures.  No DEXA scan has been obtained yet.  Exam:  BP (!) 148/84   Pulse 83   Ht 5\' 10"  (1.778 m)   Wt 194 lb (88 kg)   SpO2 98%   BMI 27.84 kg/m  General: Well Developed, well nourished, and in no acute distress.   MSK: Left knee normal-appearing Normal motion. Nontender. Stable ligamentous exam. Lower extremity strength is intact.  Unable to reproduce posterior thigh pain with resisted knee flexion.  Lumbar motion normal intact.      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  No acute fractures are visible. Await formal radiology review   EXAM: MRI LUMBAR SPINE WITHOUT CONTRAST  TECHNIQUE: Multiplanar, multisequence MR imaging of the lumbar spine was performed. No intravenous contrast was administered.  COMPARISON: None.  FINDINGS: Segmentation: Standard.  Alignment: Trace retrolisthesis at L3-L4.  Vertebrae: Chronic compression fracture of L1 with approximately 50% loss of vertebral body height at the superior endplate. Postoperative changes at L4-L5 with rods and pedicle screws and interbody spacer. Hardware is not well evaluated on this study  and there is associated susceptibility artifact. There is no marrow edema. No suspicious osseous lesion.  Conus medullaris and cauda equina: Conus extends to the L1 level. Conus and cauda equina appear normal.  Paraspinal and other soft tissues: Unremarkable.  Disc levels: Congenital narrowing of the spinal canal.  T12-L1: Minimal disc bulge. No canal or foraminal stenosis.  L1-L2: Disc bulge with punctate central annular fissure. Minor facet arthropathy. Mild canal stenosis. No foraminal stenosis.  L2-L3: Minimal disc bulge. Minor facet arthropathy. Mild canal stenosis. No foraminal stenosis.  L3-L4: Minimal disc bulge. Moderate facet arthropathy with ligamentum flavum infolding. Mild to moderate canal stenosis. No foraminal stenosis.  L4-L5: Operative level. Canal and foramina are decompressed.  L5-S1: Minimal disc bulge. Moderate to marked facet arthropathy, right greater than left, with ligamentum flavum infolding. Circumferential epidural fat partially effaces the thecal sac. No foraminal stenosis.  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 No high-grade stenosis. Facet arthropathy.  Chronic L1 compression fracture.   Electronically Signed By: Guadlupe Spanish M.D. On: 09/10/2020 08:18   EXAM: MRI THORACIC SPINE WITHOUT CONTRAST  TECHNIQUE: Multiplanar, multisequence MR imaging of the thoracic spine was performed. No intravenous contrast was administered.  COMPARISON: Lumbar MRI 09/09/2020 and abdominopelvic CT 12/17/2021.  FINDINGS: Alignment: Physiologic.  Vertebrae: Inferior endplate compression fracture at T5 is associated with marrow edema and approximately 15% loss of vertebral body height. No significant osseous retropulsion. An old healed superior endplate compression deformity at L1 is unchanged. No other acute fractures or suspicious marrow lesions.  Cord: The  thoracic  cord appears normal in signal and caliber.  Paraspinal and other soft tissues: Mild paraspinous edema at the level of the T5 fracture. No focal fluid collection or other paraspinal abnormality identified.  Disc levels:  No evidence of thoracic disc herniation, spinal stenosis or nerve root encroachment. Mild disc bulging at C6-7 and T12-L1. The foramina appear sufficiently patent at each level.  IMPRESSION: 1. Acute/subacute inferior endplate compression fracture at T5 with associated mild marrow and anterior paraspinal edema. No osseous retropulsion. 2. Old healed superior endplate compression fracture at L1. 3. Minimal spondylosis. No disc herniation, spinal stenosis or nerve root encroachment.   Electronically Signed By: Carey Bullocks M.D. On: 02/17/2022 13:34  I, Clementeen Graham, personally (independently) visualized and performed the interpretation of the images attached in this note.  Assessment and Plan: 72 y.o. male with resolving left knee pain.  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  Plan to continue Voltaren gel and activity as tolerated.  Certainly could proceed with steroid injection sooner if needed.  He is going to [place] in about 2 months.  Identified history of vertebral compression fractures about 2 years ago.  These are now feeling better but he never had a DEXA scan.  Plan for DEXA scan and osteoporosis management once we get the results back.   PDMP not reviewed this encounter. Orders Placed This Encounter  Procedures   DG BONE DENSITY (DXA)    Standing Status:   Future    Standing Expiration Date:   11/17/2023    Order Specific Question:   Reason for Exam (SYMPTOM  OR DIAGNOSIS REQUIRED)    Answer:   eval bone density hx veterberial compresison fx    Order Specific Question:   Preferred imaging location?    Answer:   [facility]   DG Knee AP/LAT W/[facility] Left    Standing Status:   Future    Number of Occurrences:   1    Standing  Expiration Date:   12/17/2022    Order Specific Question:   Reason for Exam (SYMPTOM  OR DIAGNOSIS REQUIRED)    Answer:   left knee pain    Order Specific Question:   Preferred imaging location?    Answer:   [facility]   No orders of the defined types were placed in this encounter.    Discussed warning signs or symptoms. Please see discharge instructions. Patient expresses understanding.   The above documentation has been reviewed and is accurate and complete Clementeen Graham, M.D.

## 2022-11-17 NOTE — Patient Instructions (Addendum)
Thank you for coming in today.   Please get an Xray today before you leave   You should hear about scheduling your DEXA scan within 1 week. If you do not hear please let me know.    Check back after DEXA scan  Have your wife schedule a visit with me as well. Happy to see her too!

## 2022-11-20 ENCOUNTER — Ambulatory Visit (INDEPENDENT_AMBULATORY_CARE_PROVIDER_SITE_OTHER)
Admission: RE | Admit: 2022-11-20 | Discharge: 2022-11-20 | Disposition: A | Discharge status: Home / Self Care | Payer: PPO | Source: Ambulatory Visit | Attending: Family Medicine | Admitting: Family Medicine

## 2022-11-20 DIAGNOSIS — S22050S Wedge compression fracture of T5-T6 vertebra, sequela: Secondary | ICD-10-CM | POA: Diagnosis not present

## 2022-11-23 ENCOUNTER — Encounter: Payer: Self-pay | Admitting: Family Medicine

## 2022-11-23 NOTE — Telephone Encounter (Signed)
Forwarding to Dr. Denyse Amass to advise.   Evenity, Prolia, Tymlos?

## 2022-11-23 NOTE — Progress Notes (Signed)
Bone density is low enough that in the hips you have osteoporosis.  Will talk about this in more detail when you return on the 25th but likely will be starting medication for osteoporosis management.

## 2022-11-24 NOTE — Progress Notes (Signed)
Left knee x-ray shows some arthritis changes.

## 2022-11-25 DIAGNOSIS — H2513 Age-related nuclear cataract, bilateral: Secondary | ICD-10-CM | POA: Diagnosis not present

## 2022-11-26 DIAGNOSIS — R21 Rash and other nonspecific skin eruption: Secondary | ICD-10-CM | POA: Diagnosis not present

## 2022-11-30 ENCOUNTER — Encounter: Payer: Self-pay | Admitting: Cardiology

## 2022-12-01 ENCOUNTER — Ambulatory Visit: Payer: PPO | Admitting: Family Medicine

## 2022-12-01 VITALS — BP 132/78 | HR 74 | Ht 70.0 in | Wt 195.0 lb

## 2022-12-01 DIAGNOSIS — S22050S Wedge compression fracture of T5-T6 vertebra, sequela: Secondary | ICD-10-CM | POA: Diagnosis not present

## 2022-12-01 DIAGNOSIS — M816 Localized osteoporosis [Lequesne]: Secondary | ICD-10-CM | POA: Diagnosis not present

## 2022-12-01 LAB — COMPREHENSIVE METABOLIC PANEL
ALT: 15 U/L (ref 0–53)
AST: 14 U/L (ref 0–37)
Albumin: 4.3 g/dL (ref 3.5–5.2)
Alkaline Phosphatase: 75 U/L (ref 39–117)
BUN: 14 mg/dL (ref 6–23)
CO2: 31 mEq/L (ref 19–32)
Calcium: 9.8 mg/dL (ref 8.4–10.5)
Chloride: 101 mEq/L (ref 96–112)
Creatinine, Ser: 0.93 mg/dL (ref 0.40–1.50)
GFR: 83.52 mL/min (ref >60.00)
Glucose, Bld: 99 mg/dL (ref 70–99)
Potassium: 3.9 mEq/L (ref 3.5–5.1)
Sodium: 138 mEq/L (ref 135–145)
Total Bilirubin: 0.5 mg/dL (ref 0.2–1.2)
Total Protein: 7.5 g/dL (ref 6.0–8.3)

## 2022-12-01 MED ORDER — ALENDRONATE SODIUM 70 MG PO TABS: 70.0000 mg | ORAL_TABLET | ORAL | 11 refills | Status: DC

## 2022-12-01 NOTE — Progress Notes (Signed)
Rubin Payor, PhD, LAT, ATC acting as a scribe for Clementeen Graham, MD.  Albert [name] is a 72 y.o. male who presents to [facility] at [facility] today for f/u after bone density scan w/ chronic compression fx at T5 and L knee pain. Pt was last seen by Dr. Denyse Amass on 11/17/22 and was advised to use Voltaren gel and a DEXA scan was ordered. He has an upcoming trip to [place] planned for Aug 18th.  Today, pt reports he was surprised w/ his DEXA scan results. He notes his mom had osteoporosis. Back and L knee pain has resolved.   Dx testing: 11/20/22 DEXA scan  11/17/22 L knee XR  Pertinent review of systems: No fevers or chills  Relevant historical information: History of 2 separate vertebral compression fractures.   Exam:  BP 132/78   Pulse 74   Ht 5\' 10"  (1.778 m)   Wt 195 lb (88.5 kg)   SpO2 97%   BMI 27.98 kg/m  General: Well Developed, well nourished, and in no acute distress.   MSK: Normal lumbar motion    Lab and Radiology Results  Date of study: 11/20/2022 Exam: DUAL X-RAY ABSORPTIOMETRY (DXA) FOR BONE MINERAL DENSITY (BMD) Instrument: [employer] Requesting Provider: PCP Indication: screening for osteoporosis Comparison:  none (please note that it is not possible to compare data from different instruments) Clinical data: Pt is a 72 y.o. male with history of fracture.    Results:   Lumbar spine L1-L3 Femoral neck (FN)  T-score   +0.6 RFN: -2.0 LFN: -2.6    Assessment: Patient has OSTEOPOROSIS according to the [facility] classification for osteoporosis (see below).     Fracture risk: high L4 vertebra had to be excluded from analysis    Comments: the technical quality of the study is good. In patients older than 75, the high prevalence of degenerative changes in the lumbar spine results in artificially higher bone density readings.      WHO criteria for diagnosis of osteoporosis in postmenopausal women and in men 43 y/o or older:  -  normal: T-score -1.0 to + 1.0 - osteopenia/low bone density: T-score between -2.5 and -1.0 - osteoporosis: T-score below -2.5 - severe osteoporosis: T-score below -2.5 with history of fragility fracture Note: although not part of the WHO classification, the presence of a fragility fracture, regardless of the T-score, should be considered diagnostic of osteoporosis, provided other causes for the fracture have been excluded.   Treatment: The National Osteoporosis Foundation recommends that treatment be considered in postmenopausal women and men age 30 or older with: 1. Hip or vertebral (clinical or morphometric) fracture 2. T-score of - 2.5 or lower at the spine or hip 3. 10-year fracture probability by FRAX of at least 20% for a major osteoporotic fracture and 3% for a hip fracture   RECOMMENDATION:   1. All patients should optimize calcium and vitamin D intake. 2. Consider FDA-approved medical therapies in postmenopausal women and men aged 25 years and older, based on the following: a. A hip or vertebral(clinical or morphometric) fracture. b. T-Score of  -2.5 or less at the femoral neck , total hip or spine after appropriate evaluation to exclude secondary causes c. Low bone mass (T-score between -1.0 and -2.5 at the femoral neck or spine) and a 10 year probability of a hip fracture >3% or a 10 year probability of major osteoporosis-related fracture > 20% based on the [place]-adapted WHO algorithm d. Clinical judgement and/or patient  preferences may indicate treatment for people with 10-year fracture probabilities above or below these levels     Followup: Repeat BMD in 2 -3 years or as clinically indicated     Interpreted by :   Lyndle Herrlich, MD [facility]     Assessment and Plan: 72 y.o. male with osteoporosis.  T-score is -2.6.  That plus history of compression fracture is concerning for significant osteoporosis.  We talked about options.  After lengthy  discussion of his options including bisphosphonates and potentially even Forteo we elected to use all Fosamax.  Forteo would be a better medicine but the frequency of injections is a bit much for him right now.  Will go ahead and check metabolic panel vitamin D and parathyroid hormone levels.  There may be a reason why he has such low bone mineral density.  He already is doing weightbearing exercise which is appropriate.  We talked about resistance training.   PDMP not reviewed this encounter. Orders Placed This Encounter  Procedures   Comprehensive metabolic panel    Standing Status:   Future    Number of Occurrences:   1    Standing Expiration Date:   12/01/2023   VITAMIN D 25 Hydroxy (Vit-D Deficiency, Fractures)    Standing Status:   Future    Number of Occurrences:   1    Standing Expiration Date:   12/01/2023   PTH, Intact and Calcium    Standing Status:   Future    Number of Occurrences:   1    Standing Expiration Date:   12/01/2023   Meds ordered this encounter  Medications   alendronate (FOSAMAX) 70 MG tablet    Sig: Take 1 tablet (70 mg total) by mouth every 7 (seven) days.    Dispense:  4 tablet    Refill:  11     Discussed warning signs or symptoms. Please see discharge instructions. Patient expresses understanding.   The above documentation has been reviewed and is accurate and complete Clementeen Graham, M.D.

## 2022-12-01 NOTE — Patient Instructions (Addendum)
Thank you for coming in today.   Please get labs today before you leave   Start Fosamax.   Recheck in 12 months.

## 2022-12-02 ENCOUNTER — Ambulatory Visit: Payer: PPO | Admitting: Family Medicine

## 2022-12-02 LAB — TIQ-NTM

## 2022-12-02 NOTE — Progress Notes (Signed)
Vitamin D and calcium and metabolic panel are normal.  I am waiting on the parathyroid hormone lab to come back.

## 2022-12-02 NOTE — Telephone Encounter (Signed)
Forwarding to Dr. Corey to review and advise.  

## 2022-12-03 LAB — PTH, INTACT AND CALCIUM: Calcium: 9.7 mg/dL (ref 8.6–10.3)

## 2022-12-03 LAB — VITAMIN D 25 HYDROXY (VIT D DEFICIENCY, FRACTURES): Vit D, 25-Hydroxy: 37 ng/mL (ref 30–100)

## 2022-12-03 NOTE — Telephone Encounter (Signed)
Should be fine.

## 2022-12-04 NOTE — Progress Notes (Signed)
Vitamin D looks okay.  Calcium looks okay.  Parathyroid hormone from the reason did not get done correctly.  Not an emergency we can get that test in the later date.

## 2022-12-18 DIAGNOSIS — R3121 Asymptomatic microscopic hematuria: Secondary | ICD-10-CM | POA: Diagnosis not present

## 2022-12-18 DIAGNOSIS — R1032 Left lower quadrant pain: Secondary | ICD-10-CM | POA: Diagnosis not present

## 2022-12-29 DIAGNOSIS — R1032 Left lower quadrant pain: Secondary | ICD-10-CM | POA: Diagnosis not present

## 2023-01-01 DIAGNOSIS — R319 Hematuria, unspecified: Secondary | ICD-10-CM | POA: Diagnosis not present

## 2023-01-11 DIAGNOSIS — L821 Other seborrheic keratosis: Secondary | ICD-10-CM | POA: Diagnosis not present

## 2023-01-11 DIAGNOSIS — L720 Epidermal cyst: Secondary | ICD-10-CM | POA: Diagnosis not present

## 2023-01-11 DIAGNOSIS — L570 Actinic keratosis: Secondary | ICD-10-CM | POA: Diagnosis not present

## 2023-01-21 DIAGNOSIS — I10 Essential (primary) hypertension: Secondary | ICD-10-CM | POA: Diagnosis not present

## 2023-01-21 DIAGNOSIS — I493 Ventricular premature depolarization: Secondary | ICD-10-CM | POA: Diagnosis not present

## 2023-01-21 DIAGNOSIS — D6869 Other thrombophilia: Secondary | ICD-10-CM | POA: Diagnosis not present

## 2023-01-21 DIAGNOSIS — Z86711 Personal history of pulmonary embolism: Secondary | ICD-10-CM | POA: Diagnosis not present

## 2023-01-21 DIAGNOSIS — G4700 Insomnia, unspecified: Secondary | ICD-10-CM | POA: Diagnosis not present

## 2023-01-21 DIAGNOSIS — E7800 Pure hypercholesterolemia, unspecified: Secondary | ICD-10-CM | POA: Diagnosis not present

## 2023-01-21 DIAGNOSIS — M810 Age-related osteoporosis without current pathological fracture: Secondary | ICD-10-CM | POA: Diagnosis not present

## 2023-01-21 DIAGNOSIS — I341 Nonrheumatic mitral (valve) prolapse: Secondary | ICD-10-CM | POA: Diagnosis not present

## 2023-02-14 ENCOUNTER — Encounter: Payer: Self-pay | Admitting: Hematology and Oncology

## 2023-03-12 DIAGNOSIS — U071 COVID-19: Secondary | ICD-10-CM | POA: Diagnosis not present

## 2023-04-20 DIAGNOSIS — L578 Other skin changes due to chronic exposure to nonionizing radiation: Secondary | ICD-10-CM | POA: Diagnosis not present

## 2023-04-20 DIAGNOSIS — L570 Actinic keratosis: Secondary | ICD-10-CM | POA: Diagnosis not present

## 2023-04-20 DIAGNOSIS — L821 Other seborrheic keratosis: Secondary | ICD-10-CM | POA: Diagnosis not present

## 2023-04-20 DIAGNOSIS — D225 Melanocytic nevi of trunk: Secondary | ICD-10-CM | POA: Diagnosis not present

## 2023-04-22 DIAGNOSIS — Z23 Encounter for immunization: Secondary | ICD-10-CM | POA: Diagnosis not present

## 2023-05-03 DIAGNOSIS — S51811A Laceration without foreign body of right forearm, initial encounter: Secondary | ICD-10-CM | POA: Diagnosis not present

## 2023-05-03 DIAGNOSIS — W548XXA Other contact with dog, initial encounter: Secondary | ICD-10-CM | POA: Diagnosis not present

## 2023-05-21 ENCOUNTER — Encounter: Payer: Self-pay | Admitting: Cardiology

## 2023-06-14 ENCOUNTER — Encounter: Payer: Self-pay | Admitting: Hematology and Oncology

## 2023-07-19 ENCOUNTER — Telehealth: Payer: Self-pay

## 2023-07-19 NOTE — Telephone Encounter (Signed)
 Received call from pt asking to schedule appt with MD for f/u. He prefers to follow up with Dr Lee Public annually since he is on Xarelto  indefinitely. Pt scheduled for MyChart Video Visit 08/09/23 3 PM. He knows to call with any concerns in the interim.

## 2023-08-09 ENCOUNTER — Inpatient Hospital Stay: Payer: PPO | Attending: Hematology and Oncology | Admitting: Hematology and Oncology

## 2023-08-09 DIAGNOSIS — Z86711 Personal history of pulmonary embolism: Secondary | ICD-10-CM | POA: Insufficient documentation

## 2023-08-09 DIAGNOSIS — Z7901 Long term (current) use of anticoagulants: Secondary | ICD-10-CM | POA: Insufficient documentation

## 2023-08-09 DIAGNOSIS — I82409 Acute embolism and thrombosis of unspecified deep veins of unspecified lower extremity: Secondary | ICD-10-CM | POA: Diagnosis not present

## 2023-08-09 DIAGNOSIS — Z86718 Personal history of other venous thrombosis and embolism: Secondary | ICD-10-CM | POA: Insufficient documentation

## 2023-08-09 MED ORDER — RIVAROXABAN 20 MG PO TABS: 20.0000 mg | ORAL_TABLET | Freq: Every day | ORAL | 3 refills | Status: AC

## 2023-08-09 NOTE — Assessment & Plan Note (Signed)
 Recurrent DVT and PE:  First episode after back surgery and 2005 (DVT and PE) treated with Coumadin for 1 year Second episode 2007 DVT right leg: To Coumadin for 1 year Third episode 2014: DVT and PE: Now remains on indefinite anticoagulation   In [place] he had extensive blood work for inherited and acquired risk factors for blood clots and he was told that there was no such risk. He does not smoke cigarettes and stays very active and he is not overweight.  There is no family history of blood clots either. Patient has retired from [employer].   Patient moved from [place] to [place] 05/22/2019.  This is to be closer to their grandchildren.   Current treatment: Coumadin switched to Xarelto 07/03/2019 (lupus anticoagulant negative): Lifelong anticoagulation    Xarelto toxicities: None He is able to afford Xarelto.  When he goes in the donut hole he applies for manufacturer assistance and he is getting it.

## 2023-08-09 NOTE — Progress Notes (Signed)
 MY CHART Virtual visit  Patient Care Team: Tally Joe, MD as PCP - General (Family Medicine) Marykay Lex, MD as PCP - Cardiology (Cardiology)  DIAGNOSIS:  Encounter Diagnosis  Name Primary?   Recurrent acute deep vein thrombosis (DVT) of lower extremity, unspecified laterality (HCC) Yes   CHIEF COMPLIANT:   HISTORY OF PRESENT ILLNESS:  History of Present Illness The patient, with a history of osteoporosis and on long-term anticoagulation with Xarelto, presents for a medication review. He has been on Xarelto for four years and reports no side effects. He manages the cost of the medication by purchasing a 90-day supply. He is also on Fosamax, Pepcid, an inhaler, Ativan, Toprol XL, Crestor, and Gas-X.  The patient had a kyphoplasty last year due to a broken disc. A subsequent bone density check revealed osteoporosis in the hips. The patient has been researching potential causes and is concerned about a possible link to low levels of vitamin K, which he has been avoiding due to his anticoagulation therapy.  The patient also reports a history of back problems, but recent bone density scores for the spine were excellent. He is due for a follow-up bone density check in over a year. He has an upcoming appointment with his prescribing doctor for Fosamax.     ALLERGIES:  is allergic to aspirin, nsaids, pantoprazole, and penicillins.  MEDICATIONS:  Current Outpatient Medications  Medication Sig Dispense Refill   acetaminophen (TYLENOL) 500 MG tablet Take 1,000 mg by mouth every 6 (six) hours as needed for moderate pain.     alendronate (FOSAMAX) 70 MG tablet Take 1 tablet (70 mg total) by mouth every 7 (seven) days. 4 tablet 11   diclofenac Sodium (VOLTAREN) 1 % GEL Apply 4 g topically 4 (four) times daily.     famotidine (PEPCID) 20 MG tablet Take 20 mg by mouth 2 (two) times daily as needed for heartburn or indigestion.     fluticasone (FLONASE) 50 MCG/ACT nasal spray Place 2 sprays  into both nostrils daily.     LORazepam (ATIVAN) 1 MG tablet Take 2 mg by mouth at bedtime as needed for sleep.     metoprolol succinate (TOPROL-XL) 50 MG 24 hr tablet TAKE 1 TABLET BY MOUTH EVERY DAY WITH OR IMMEDIATELY FOLLOWING A MEAL 90 tablet 3   metoprolol tartrate (LOPRESSOR) 25 MG tablet Take 1 tablet (25 mg total) by mouth 2 (two) times daily as needed. Breakthrough of PVC. Continue taking Metoprolol succinate daily 30 tablet 6   rivaroxaban (XARELTO) 20 MG TABS tablet Take 1 tablet (20 mg total) by mouth daily with supper. 90 tablet 3   rosuvastatin (CRESTOR) 10 MG tablet Take 1 tablet (10 mg total) by mouth daily. 90 tablet 3   Simethicone (GAS-X PO) Take 1 tablet by mouth 2 (two) times daily as needed (gas).     No current facility-administered medications for this visit.    PHYSICAL EXAMINATION: ECOG PERFORMANCE STATUS: 1 - Symptomatic but completely ambulatory    LABORATORY DATA:  I have reviewed the data as listed    Latest Ref Rng & Units 12/01/2022    2:46 PM 01/27/2022    1:23 PM  CMP  Glucose 70 - 99 mg/dL 99  161   BUN 6 - 23 mg/dL 14  12   Creatinine 0.96 - 1.50 mg/dL 0.45  4.09   Sodium 811 - 145 mEq/L 138  138   Potassium 3.5 - 5.1 mEq/L 3.9  4.5   Chloride 96 - 112  mEq/L 101  105   CO2 19 - 32 mEq/L 31  25   Calcium 8.4 - 10.5 mg/dL 8.6 - 65.7 mg/dL 9.8    9.7  9.4   Total Protein 6.0 - 8.3 g/dL 7.5    Total Bilirubin 0.2 - 1.2 mg/dL 0.5    Alkaline Phos 39 - 117 U/L 75    AST 0 - 37 U/L 14    ALT 0 - 53 U/L 15      Lab Results  Component Value Date   WBC 5.6 01/27/2022   HGB 14.1 01/27/2022   HCT 41.7 01/27/2022   MCV 99.5 01/27/2022   PLT 182 01/27/2022    ASSESSMENT & PLAN:  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 He does not smoke cigarettes and stays very active and he is not overweight.  There is no family history of blood clots either. Patient has retired from [employer].   Patient moved from [place] to [place] 05/22/2019.  This is to be closer to their grandchildren.   Current treatment: Coumadin switched to Xarelto 07/03/2019 (lupus anticoagulant negative): Lifelong anticoagulation    Xarelto toxicities: None He is able to afford Xarelto.  When he goes in the donut hole he applies for manufacturer assistance and he is getting it.  ------------------------------------- Assessment and Plan Assessment & Plan Anticoagulation Tolerating Xarelto well with no reported side effects. Patient has been on Xarelto for 4 years. Discussed the cost and the patient is managing it with a 90-day supply. -Refilled Xarelto for a year to [facility] in [place], [place].  Osteoporosis Patient has osteoporosis in the hips, but the spine is strong. Patient is currently on Fosamax. Discussed the role of Vitamin K in bone health and clotting. Patient inquired about dietary intake of Vitamin K. -Continue Fosamax as prescribed. -Allowed dietary intake of Vitamin K, but avoid supplements. -Follow-up with Dr. Denyse Amass in June for Fosamax prescription renewal. -Repeat bone density test in June 2026.  Follow-up in 1 year.      No orders of the defined types were placed in this encounter.  The patient has a good understanding of the overall plan. he agrees with it. he will call with any problems that may develop before the next visit here. Total time spent: 30 mins including face to face time and time spent for planning, charting and co-ordination of care   Tamsen Meek, MD 08/09/23

## 2023-08-16 ENCOUNTER — Telehealth: Payer: Self-pay | Admitting: Hematology and Oncology

## 2023-08-16 NOTE — Telephone Encounter (Signed)
 Scheduled appointment per 3/3 los. Left VM with appointment details.

## 2023-08-23 ENCOUNTER — Encounter: Payer: Self-pay | Admitting: Cardiology

## 2023-08-23 ENCOUNTER — Ambulatory Visit: Payer: PPO | Attending: Cardiology | Admitting: Cardiology

## 2023-08-23 VITALS — BP 126/80 | HR 71 | Ht 70.0 in | Wt 202.0 lb

## 2023-08-23 DIAGNOSIS — I82409 Acute embolism and thrombosis of unspecified deep veins of unspecified lower extremity: Secondary | ICD-10-CM

## 2023-08-23 DIAGNOSIS — E785 Hyperlipidemia, unspecified: Secondary | ICD-10-CM

## 2023-08-23 DIAGNOSIS — I493 Ventricular premature depolarization: Secondary | ICD-10-CM | POA: Diagnosis not present

## 2023-08-23 DIAGNOSIS — I10 Essential (primary) hypertension: Secondary | ICD-10-CM | POA: Diagnosis not present

## 2023-08-23 DIAGNOSIS — I340 Nonrheumatic mitral (valve) insufficiency: Secondary | ICD-10-CM

## 2023-08-23 DIAGNOSIS — I341 Nonrheumatic mitral (valve) prolapse: Secondary | ICD-10-CM | POA: Diagnosis not present

## 2023-08-23 MED ORDER — METOPROLOL SUCCINATE ER 50 MG PO TB24: 50.0000 mg | ORAL_TABLET | Freq: Every day | ORAL | 3 refills | Status: AC

## 2023-08-23 MED ORDER — ROSUVASTATIN CALCIUM 10 MG PO TABS: 10.0000 mg | ORAL_TABLET | Freq: Every day | ORAL | 3 refills | Status: AC

## 2023-08-23 NOTE — Patient Instructions (Addendum)
 Medication Instructions:  No changes   *If you need a refill on your cardiac medications before your next appointment, please call your pharmacy*   Lab Work: Not needed    Testing/Procedures:  Will be schedule in 2027 prior to your appt with Dr Herbie Baltimore of 2027( not schedule today) Your physician has requested that you have an echocardiogram. Echocardiography is a painless test that uses sound waves to create images of your heart. It provides your doctor with information about the size and shape of your heart and how well your heart's chambers and valves are working. This procedure takes approximately one hour. There are no restrictions for this procedure. Please do NOT wear cologne, perfume, aftershave, or lotions (deodorant is allowed). Please arrive 15 minutes prior to your appointment time.  Please note: We ask at that you not bring children with you during ultrasound (echo/ vascular) testing. Due to room size and safety concerns, children are not allowed in the ultrasound rooms during exams. Our front office staff cannot provide observation of children in our lobby area while testing is being conducted. An adult accompanying a patient to their appointment will only be allowed in the ultrasound room at the discretion of the ultrasound technician under special circumstances. We apologize for any inconvenience.    Follow-Up: At [facility], you and your health needs are our priority.  As part of our continuing mission to provide you with exceptional heart care, we have created designated Provider Care Teams.  These Care Teams include your primary Cardiologist (physician) and Advanced Practice Providers (APPs -  Physician Assistants and Nurse Practitioners) who all work together to provide you with the care you need, when you need it.     Your next appointment:   12 month(s)  The format for your next appointment:   In Person  Provider:   Edd Fabian, FNP or Bernadene Person, NP     Then, Bryan Lemma, MD will plan to see you again in 24 month(s).

## 2023-08-23 NOTE — Progress Notes (Unsigned)
 Cardiology Office Note:  .   Date:  08/25/2023  ID:  Albert [name]. Albert, [name] 05-14-1951, MRN [id number] PCP: Tally Joe, MD  [facility] Providers Cardiologist:  Bryan Lemma, MD     Chief Complaint  Patient presents with   Follow-up    No major complaints.  Stable   Cardiac Valve Problem    Echo showed mild progression of MVP/MR    Patient Profile: .     Albert [name]. Hert is a  73 y.o. male  with a PMH reviewed below who presents here for annual follow-up discussed valvular heart disease and cardiac risk factors we will at the request of Tally Joe, MD.  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  Treated with Coumadin for 1 year. 2007: DVT right leg.  Treated with Coumadin for 1 year. 2014: DVT and PE.  Now on lifelong anticoagulation.  (Switched from Coumadin to Xarelto January 2021). Followed by heme-onc. Mitral Valve Prolapse. Echo 07/17/2020: EF 60 to 65%.  Mild to moderate mitral valve regurgitation.  No mitral valve stenosis.  Mild late systolic prolapse of the middle scallop of the posterior leaflet of the mitral valve.  Mild aortic valve sclerosis without stenosis. Hypertension. Hyperlipidemia. Lipid panel 07/31/2022: LDL 117, HDL 52, TG 112, total 189. Lipid panel 07/26/2023: LDL 66, HDL 50, TC 139, TG 131. 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    Albert [name]. Lamagna was last seen by Carlos Levering, NP, NP on 08/14/2022 for annual follow-up.  He was doing well with no major complaints.  No dyspnea or chest pain.  His PCP referred him to vascular surgery for his history of DVT and PEs--no plans for invasive procedures..  Remains on Xarelto.  No recurrent DVT or PE since 2014.  Walking his dog 5 or 6 times a day and also walking with his wife.  Doing household chores.  Subjective  Discussed the use of AI scribe software for clinical note transcription with the patient, who gave verbal consent to proceed.  History of Present Illness    Albert [name]. Shisler "[name]" is a 73 year old male with a history of PVCs, Mild Mitral Prolapse, and DVT-PR who presents for a cardiology follow-up.  He has not experienced any major cardiac issues since his last visit. He can induce and stop his PVCs by manipulating his stomach, particularly when it is bloated. He describes pressing on his stomach to bring on PVCs and stopping them by sitting back, exhaling, and burping. No fast heart rates, skipped beats, or passing out spells. He is currently taking metoprolol ER 50 mg once daily at night, which he believes keeps his PVCs under control. He was previously prescribed Lopressor 25 mg for bothersome PVCs but has only used it twice since filling the prescription.  He has a history of DVTs and has been hospitalized three times for PEs. He is on lifelong anticoagulation therapy with Xarelto, which was recently renewed for another year. Last year, he was referred to a vascular surgeon due to concerns about his legs. An ultrasound of both legs showed no significant issues. No significant swelling in his legs, although he occasionally notices slight swelling around his ankles, likely due to sock impressions.  He mentions having osteoporosis, which was diagnosed via a bone density scan.  He is physically active, regularly walking and exercising with his wife, who is diabetic. His blood pressure and cholesterol levels are well-controlled, with recent lab results showing a total cholesterol of 139, triglycerides  of 131, HDL of 50, and LDL of 66. He is on rosuvastatin, which he takes as prescribed.        Objective    Studies Reviewed: Marland Kitchen   EKG Interpretation Date/Time:  Monday August 23 2023 12:06:01 EDT Ventricular Rate:  71 PR Interval:  190 QRS Duration:  86 QT Interval:  360 QTC Calculation: 391 R Axis:   30  Text Interpretation: Normal sinus rhythm Normal ECG No previous ECGs available Confirmed by Bryan Lemma ([id number]) on 08/23/2023 12:23:12 PM     ECHO 09/15/2022 (unchanged from 2022): EF 60 to 65%.  No RWMA.  Normal PAP.  Mild to moderate MR with mild late MVP of the posterior leaflet.  AOV sclerosis with no stenosis. Lipid panel 07/26/2023 (from PCP [facility]): LDL 66, HDL 50, TC 139, TG 131. NA 140, K4.6, CL 102, CO2 31, BUN 15,Cr 0.9, GLU 94, CA 9.09; AST 50, ALT 20, AP 56; CBC W6.8, H/H14.7/40.1, PLT 195  Risk Assessment/Calculations:         Physical Exam:   VS:  BP 126/80   Pulse 71   Ht 5\' 10"  (1.778 m)   Wt 202 lb (91.6 kg)   SpO2 97%   BMI 28.98 kg/m    Wt Readings from Last 3 Encounters:  08/23/23 202 lb (91.6 kg)  12/01/22 195 lb (88.5 kg)  11/17/22 194 lb (88 kg)    GEN: Well nourished, well groomed in no acute distress; healthy-appearing NECK: No JVD; No carotid bruits CARDIAC: Normal S1, S2; RRR, no murmurs, rubs, gallops RESPIRATORY:  Clear to auscultation without rales, wheezing or rhonchi ; nonlabored, good air movement. ABDOMEN: Soft, non-tender, non-distended EXTREMITIES:  No edema; No deformity     ASSESSMENT AND PLAN: .    Problem List Items Addressed This Visit       Cardiology Problems   Essential hypertension (Chronic)   Relevant Medications   metoprolol succinate (TOPROL-XL) 50 MG 24 hr tablet   rosuvastatin (CRESTOR) 10 MG tablet   Other Relevant Orders   EKG 12-Lead (Completed)   Frequent PVCs (Chronic)   Premature Ventricular Contractions (PVCs) -> stable on current dose of beta-blocker no recurrent episodes requiring as needed short acting Lopressor. PVCs controlled with metoprolol ER 50 mg daily, not bothersome. - Continue metoprolol ER 50 mg once daily. - Discontinue PRN Lopressor 25 mg.       Relevant Medications   metoprolol succinate (TOPROL-XL) 50 MG 24 hr tablet   rosuvastatin (CRESTOR) 10 MG tablet   Hyperlipidemia LDL goal <100 (Chronic)   Cholesterol levels controlled with rosuvastatin. Recent labs: total cholesterol 130, triglycerides 131, HDL 50,  LDL 66. 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 No significant findings on exam.  Continue to follow - Schedule echocardiogram in 2027.        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 Recent evaluation showed good leg condition. - Continue Xarelto as prescribed. - Wear  compression stockings to prevent venous dilation.      Relevant Medications   metoprolol succinate (TOPROL-XL) 50 MG 24 hr tablet   rosuvastatin (CRESTOR) 10 MG tablet    Recording duration: 13 minutes           Follow-Up: Return in about 1 year (around 08/22/2024). with APP in one year and with cardiologist in two years.    Signed, Marykay Lex, MD, MS Bryan Lemma, M.D., M.S. Interventional Cardiologist  [facility]  Pager # [phone] Phone # [phone] [address] [place], [place] [address]

## 2023-08-24 ENCOUNTER — Encounter: Payer: Self-pay | Admitting: Hematology and Oncology

## 2023-08-25 ENCOUNTER — Encounter: Payer: Self-pay | Admitting: Cardiology

## 2023-08-25 NOTE — Assessment & Plan Note (Signed)
 On lifelong anticoagulation with Xarelto. Recent evaluation showed good leg condition. - Continue Xarelto as prescribed. - Wear compression stockings to prevent venous dilation.

## 2023-08-25 NOTE — Assessment & Plan Note (Addendum)
 Echo from last year showed mild to moderate MR with mild late MVP posterior leaflet. No significant findings on exam.  Continue to follow - Schedule echocardiogram in 2027.

## 2023-08-25 NOTE — Assessment & Plan Note (Signed)
 Cholesterol levels controlled with rosuvastatin. Recent labs: total cholesterol 130, triglycerides 131, HDL 50, LDL 66. - Refill rosuvastatin 10 mg daily Rx

## 2023-08-25 NOTE — Assessment & Plan Note (Signed)
 Premature Ventricular Contractions (PVCs) -> stable on current dose of beta-blocker no recurrent episodes requiring as needed short acting Lopressor. PVCs controlled with metoprolol ER 50 mg daily, not bothersome. - Continue metoprolol ER 50 mg once daily. - Discontinue PRN Lopressor 25 mg.

## 2023-08-25 NOTE — Progress Notes (Unsigned)
   Rubin Payor, PhD, LAT, ATC acting as a scribe for Clementeen Graham, MD.  Albert [name] is a 73 y.o. male who presents to [facility] today for L knee pain. Pt's last visit w/ Dr. Denyse Amass for his L knee was on 11/17/22.   Today, pt reports ongoing since last weekend. He was doing some exercises w/ his wife. Pain has resolved w/ home management.   He also c/o L hand pain x 2-wks. Pt locates pain to the MCP of the L thumb and into radial aspect of the wrist. He only rates the pain as a 2/10.   Treatments tried: Tylenol, ice, Voltaren gel.  Dx testing: 11/20/22 DEXA scan             11/17/22 L knee XR  Pertinent review of systems: No fevers or chills  Relevant historical information: History of DVT on Xarelto Osteoporosis with vertebral compression fracture on Fosamax.  Exam:  BP (!) 152/88   Pulse 87   Ht 5\' 10"  (1.778 m)   SpO2 96%   BMI 28.98 kg/m  General: Well Developed, well nourished, and in no acute distress.   MSK: Left hand bossing first CMC.  Tender palpation in this region.  Normal thumb motion.    Lab and Radiology Results  X-ray images left hand obtained today personally and independently interpreted. Significant DJD first CMC.  No acute fractures are visible. Await formal radiology review   Assessment and Plan: 73 y.o. 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  Planned to continue Voltaren gel and use Tylenol arthritis.  Plan for hand therapy.  If not improved consider steroid injection.  Osteoporosis due for recheck in June.  Check back then continue Fosamax return sooner if needed.  Knee pain resolved spontaneously watchful waiting.   PDMP not reviewed this encounter. Orders Placed This Encounter  Procedures   DG Hand Complete Left    Standing Status:   Future    Number of Occurrences:   1    Expiration Date:   09/26/2023    Reason for Exam (SYMPTOM  OR DIAGNOSIS REQUIRED):   left hand pain    Preferred imaging  location?:   [facility]   Ambulatory referral to Occupational Therapy    Referral Priority:   Routine    Referral Type:   Occupational Therapy    Referral Reason:   Specialty Services Required    Requested Specialty:   Occupational Therapy    Number of Visits Requested:   1   No orders of the defined types were placed in this encounter.    Discussed warning signs or symptoms. Please see discharge instructions. Patient expresses understanding.   The above documentation has been reviewed and is accurate and complete Clementeen Graham, M.D.

## 2023-08-26 ENCOUNTER — Other Ambulatory Visit: Payer: Self-pay

## 2023-08-26 ENCOUNTER — Ambulatory Visit: Admitting: Family Medicine

## 2023-08-26 ENCOUNTER — Ambulatory Visit (INDEPENDENT_AMBULATORY_CARE_PROVIDER_SITE_OTHER)

## 2023-08-26 VITALS — BP 152/88 | HR 87 | Ht 70.0 in

## 2023-08-26 DIAGNOSIS — M79642 Pain in left hand: Secondary | ICD-10-CM | POA: Diagnosis not present

## 2023-08-26 DIAGNOSIS — M25562 Pain in left knee: Secondary | ICD-10-CM

## 2023-08-26 DIAGNOSIS — M810 Age-related osteoporosis without current pathological fracture: Secondary | ICD-10-CM

## 2023-08-26 DIAGNOSIS — G8929 Other chronic pain: Secondary | ICD-10-CM | POA: Diagnosis not present

## 2023-08-26 DIAGNOSIS — M1812 Unilateral primary osteoarthritis of first carpometacarpal joint, left hand: Secondary | ICD-10-CM | POA: Diagnosis not present

## 2023-08-26 NOTE — Patient Instructions (Addendum)
 Thank you for coming in today.   Please get an Xray today before you leave   I've referred you to Hand Therapy.  Let us know if you don't hear from them in one week.   Check back in June, as already scheduled.

## 2023-08-27 ENCOUNTER — Encounter: Payer: Self-pay | Admitting: Family Medicine

## 2023-08-27 NOTE — Progress Notes (Signed)
 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

## 2023-09-07 NOTE — Therapy (Signed)
 OUTPATIENT OCCUPATIONAL THERAPY ORTHO EVALUATION  Patient Name: Albert [name] MRN: [id number] DOB:April 11, 1951, 73 y.o., male 12 Date: 09/08/2023  PCP: Risa Grill MD REFERRING PROVIDER:  Rodolph Bong, MD    END OF SESSION:  OT End of Session - 09/08/23 1303     Visit Number 1    Number of Visits 5    Date for OT Re-Evaluation 10/08/23    Authorization Type [employer]    OT Start Time 1303    OT Stop Time 1344    OT Time Calculation (min) 41 min    Activity Tolerance Patient tolerated treatment well;No increased pain;Patient limited by pain    Behavior During Therapy [facility] for tasks assessed/performed             Past Medical History:  Diagnosis Date   Acute deep vein thrombosis of lower limb (HCC) 2005   Initial episode in 2005 with DVT and PE following low back surgery.  [Reportedly negative evaluation from second episode right leg DVT 2007 (1/2 to 1-year); third episode in 2016 now on lifetime anticoagulation.  (Does home INR checks warfarin).   Arthritis    mild hands and knees   Cancer (HCC)    basal and squamous cell   Chronic lower back pain    Titanium clamp L4-L5   Frequent PVCs    With occasional cardiac, controlled on Toprol 25 mg   GERD (gastroesophageal reflux disease)    History of anticoagulant therapy    Hypercholesterolemia    Not currently on treatment   Hypertension    Mitral valve prolapse    By historical data.  Not confirmed in 2017.   Pneumonia 1979   Pulmonary embolism (HCC)    History of multiple DVTs with PE, on chronic anticoagulation (currently on warfarin with potential conversion to Xarelto)   Tinnitus    Past Surgical History:  Procedure Laterality Date   COLONOSCOPY  2007   x2 Nov 2022   LAPAROSCOPIC APPENDECTOMY N/A 01/29/2022   Procedure: LAPAROSCOPIC APPENDECTOMY;  Surgeon: Manus Rudd, MD;  Location: [facility];  Service: General;  Laterality: N/A;   LUMBAR SPINE SURGERY  2006   L4-L5 titanium clap   TONSILLECTOMY      TRANSTHORACIC ECHOCARDIOGRAM  06/2015   LVEF 60 to 65%.  No RWMA.  GR 1 DD.  Mild MR (no mention of MVP).  Normal left atrial size.  Otherwise normal valves chamber sizes.   TRANSTHORACIC ECHOCARDIOGRAM  07/2020   EF 60 to 65%.  No or WMA.  Normal RV.  Mild to moderate MR with mild late systolic prolapse of the middle scallop of the posterior leaflet.  Aortic valve sclerosis but no stenosis.  Normal RAP.   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 He also states Lt RF triggers and locks at times, though it is not painful today.    PERTINENT HISTORY: "L hand pain x 2-wks. Pt locates pain to the MCP of the L thumb and into radial aspect of the wrist. He only rates the pain as a 2/10 "  PRECAUTIONS: None  RED FLAGS: None   WEIGHT BEARING RESTRICTIONS: No  PAIN:  Are you having pain? Yes: NPRS scale: 1/10 at rest, higher with activities, gripping up to 4/10  Pain location: Left thumb basal joint Pain description: Aching and sore Aggravating  factors: Squeezing Relieving factors: Rest  FALLS: Has patient fallen in last 6 months? No  LIVING ENVIRONMENT: Lives with: lives with their spouse Lives in: House/apartment Has following equipment at home:  wrist cock-up, compression glove  PLOF: Independent  PATIENT GOALS: To improve pain and symptoms in left thumb and hand  NEXT MD VISIT: As needed   OBJECTIVE: (All objective assessments below are from initial evaluation on: 09/08/23 unless otherwise specified.)   HAND DOMINANCE: Right   ADLs: Overall ADLs: States decreased ability to grab, hold household objects, pain and difficulty to open containers, perform FMS tasks (manipulate fasteners on clothing)   FUNCTIONAL OUTCOME MEASURES: Eval: Quick DASH 20.4% impairment today  (Higher % Score  =  More Impairment)     UPPER EXTREMITY ROM     Shoulder to Wrist AROM Right eval Left eval  Wrist flexion 55 43  Wrist extension 73 56  Wrist ulnar deviation    Wrist radial deviation    Functional dart thrower's motion (F-DTM) in ulnar flexion    F-DTM in radial extension     (Blank rows = not tested)   Hand AROM Right eval Left eval  Full Fist Ability (or Gap to Distal Palmar Crease)  Full fist, somewhat loose  Thumb Opposition  (Kapandji Scale)   9/10  Thumb MCP (0-60) 0-51 0- 37  Thumb IP (0-80) 0-70 0 -65  Thumb Radial Abduction Span  55   (MCP J hyperextends)   Thumb Palmar Abduction Span     Index MCP (0-90)     Index PIP (0-100)     Index DIP (0-70)      Long MCP (0-90)      Long PIP (0-100)      Long DIP (0-70)      Ring MCP (0-90)   0-  84  Ring PIP (0-100)   0-  86  Ring DIP (0-70)   0-  36  Little MCP (0-90)      Little PIP (0-100)      Little DIP (0-70)      (Blank rows = not tested)   HAND FUNCTION: Eval: Observed weakness in affected Lt hand.  More pain in ring finger than thumb today Grip strength Right: 96 lbs, Left: 64 lbs   COORDINATION: Eval: Only mild observed coordination  impairments with affected left hand, from lack of opposition and inhibiting pain.  Details will be tested if needed.  SENSATION: Eval:  Light touch intact today  EDEMA:   Eval: None significant today  COGNITION: Eval: Overall cognitive status: WFL for evaluation today   OBSERVATIONS:   Eval: Presents like overt left CMC joint arthritis likely in the STT joint as well due to complaints of  wrist pain.  Also having triggering of the left ring finger at the MCP joint/A1.   TODAY'S TREATMENT:  Post-evaluation treatment:   He was given education and information on management of arthritis which is included under patient instructions.  He does have some supportive braces and we discussed how to use them for maximum benefit.  We also discussed the following home exercise program to address both the thumb arthritis as well as stiffness through the digits and discussed management of triggering ring finger as well.  He was given a Band-Aid and told to wear over his PIP joint to prevent tendon excursion into his own that is painful and locking.  He should avoid repetitive gripping and squeezing.  He states understanding all directions, though these exercises will definitely need reviewed in the next session.   Exercises - Seated Wrist Flexion Stretch  - 3 x daily - 3 reps - 15 hold - Wrist Prayer Stretch  - 3 x daily - 3 reps - 15 sec hold - HOOK Stretch  - 2-3 x daily - 3-5 reps - 15-20 sec hold - Stretch Thumb DOWNWARD  - 3 x daily - 3 reps - 15 sec hold - Thumb Webspace Stretch  - 3 x daily - 3 reps - 15 sec hold - Towel Roll Grip with Forearm in Neutral  - 3 x daily - 5 reps - 10 sec hold  PATIENT EDUCATION: Education details: See tx section above for details  Person educated: Patient Education method: Verbal Instruction, Teach back, Handouts  Education comprehension: States and demonstrates understanding, Additional Education required    HOME EXERCISE PROGRAM: Access Code: [email or web address] URL:  [email or web address] Date: 09/08/2023 Prepared by: Fannie Knee   GOALS: Goals reviewed with patient? Yes   SHORT TERM GOALS: (STG required if POC>30 days) Target Date: 09/17/2023  Pt will obtain protective, custom orthotic. Goal status: TBD/PRN  2.  Pt will demo/state understanding of initial HEP to improve pain levels and prerequisite motion. Goal status: INITIAL   LONG TERM GOALS: Target Date: 10/08/23  Pt will improve functional ability by decreased impairment per Quick DASH assessment from 20.4% to 10% or better, for better quality of life. Goal status: INITIAL  2.  Pt will improve grip strength in left hand from 64 lbs to at least 70 lbs for functional use at home and in IADLs. Goal status: INITIAL  3.  Pt will improve A/ROM in left wrist flexion/extension from 43/56 respectively to at least 60 degrees each, to have functional motion for tasks like reach and grasp.  Goal status: INITIAL  4.  Pt will decrease pain at worst from 4/10 to 2/10 or better to have better sleep and occupational participation in daily roles. Goal status: INITIAL   ASSESSMENT:  CLINICAL IMPRESSION: Patient is a 72y.o. male who was seen today for occupational therapy evaluation for pain and arthritis symptoms mainly in the left thumb but also stiffness through all the digits as well as triggering left ring finger.  He will benefit from outpatient occupational therapy to decrease symptoms and increase quality of life.   PERFORMANCE DEFICITS: in functional skills including IADLs, dexterity, ROM, strength, pain, fascial restrictions, flexibility, Fine motor control, body mechanics, endurance, decreased knowledge of precautions, and UE functional use, cognitive skills including safety awareness, and psychosocial skills including coping strategies, environmental adaptation, habits, and routines and behaviors.   IMPAIRMENTS: are limiting patient from ADLs, IADLs, and leisure.    COMORBIDITIES: may have co-morbidities  that affects occupational performance. Patient  will benefit from skilled OT to address above impairments and improve overall function.  MODIFICATION OR ASSISTANCE TO COMPLETE EVALUATION: No modification of tasks or assist necessary to complete an evaluation.  OT OCCUPATIONAL PROFILE AND HISTORY: Problem focused assessment: Including review of records relating to presenting problem.  CLINICAL DECISION MAKING: Moderate - several treatment options, min-mod task modification necessary  REHAB POTENTIAL: Excellent  EVALUATION COMPLEXITY: Low      PLAN:  OT FREQUENCY: 1x/week  OT DURATION: 4 weeks through 10/08/2023 and up to 5 total visits as needed  PLANNED INTERVENTIONS: 97168 OT Re-evaluation, 97535 self care/ADL training, 16109 therapeutic exercise, 97530 therapeutic activity, 97112 neuromuscular re-education, 97140 manual therapy, 97035 ultrasound, 97039 fluidotherapy, 97010 moist heat, 97010 cryotherapy, 97760 Orthotics management and training, 60454 Subsequent splinting/medication, compression bandaging, Dry needling, energy conservation, coping strategies training, patient/family education, and DME and/or AE instructions  RECOMMENDED OTHER SERVICES: None now  CONSULTED AND AGREED WITH PLAN OF CARE: Patient  PLAN FOR NEXT SESSION:   Review initial home exercise program, consider custom orthoses as needed, develop management program also keep an eye on the trigger finger   Fannie Knee, OTR/L, CHT 09/08/2023, 4:25 PM

## 2023-09-08 ENCOUNTER — Encounter: Payer: Self-pay | Admitting: Rehabilitative and Restorative Service Providers"

## 2023-09-08 ENCOUNTER — Ambulatory Visit: Admitting: Rehabilitative and Restorative Service Providers"

## 2023-09-08 DIAGNOSIS — M6281 Muscle weakness (generalized): Secondary | ICD-10-CM | POA: Diagnosis not present

## 2023-09-08 DIAGNOSIS — M25542 Pain in joints of left hand: Secondary | ICD-10-CM

## 2023-09-08 DIAGNOSIS — M25642 Stiffness of left hand, not elsewhere classified: Secondary | ICD-10-CM

## 2023-09-08 NOTE — Patient Instructions (Signed)

## 2023-09-09 ENCOUNTER — Encounter: Payer: Self-pay | Admitting: Family Medicine

## 2023-09-10 DIAGNOSIS — H0102B Squamous blepharitis left eye, upper and lower eyelids: Secondary | ICD-10-CM | POA: Diagnosis not present

## 2023-09-10 DIAGNOSIS — G43B0 Ophthalmoplegic migraine, not intractable: Secondary | ICD-10-CM | POA: Diagnosis not present

## 2023-09-10 DIAGNOSIS — H16042 Marginal corneal ulcer, left eye: Secondary | ICD-10-CM | POA: Diagnosis not present

## 2023-09-10 DIAGNOSIS — H0102A Squamous blepharitis right eye, upper and lower eyelids: Secondary | ICD-10-CM | POA: Diagnosis not present

## 2023-09-10 DIAGNOSIS — H2513 Age-related nuclear cataract, bilateral: Secondary | ICD-10-CM | POA: Diagnosis not present

## 2023-09-10 NOTE — Telephone Encounter (Signed)
 Forwarding to Dr. Denyse Amass to review and advise.

## 2023-09-29 ENCOUNTER — Encounter: Payer: Self-pay | Admitting: Rehabilitative and Restorative Service Providers"

## 2023-09-29 ENCOUNTER — Ambulatory Visit (INDEPENDENT_AMBULATORY_CARE_PROVIDER_SITE_OTHER): Admitting: Rehabilitative and Restorative Service Providers"

## 2023-09-29 ENCOUNTER — Encounter: Admitting: Rehabilitative and Restorative Service Providers"

## 2023-09-29 DIAGNOSIS — M6281 Muscle weakness (generalized): Secondary | ICD-10-CM | POA: Diagnosis not present

## 2023-09-29 DIAGNOSIS — M25542 Pain in joints of left hand: Secondary | ICD-10-CM | POA: Diagnosis not present

## 2023-09-29 DIAGNOSIS — M25642 Stiffness of left hand, not elsewhere classified: Secondary | ICD-10-CM

## 2023-09-29 NOTE — Therapy (Signed)
 OUTPATIENT OCCUPATIONAL THERAPY TREATMENT AND DISCHARGE NOTE   Patient Name: Albert [name] MRN: [id number] DOB:Jul 18, 1950, 73 y.o., male 74 Date: 09/29/2023  PCP: Kenny Peals MD REFERRING PROVIDER:  Syliva Even, MD    END OF SESSION:  OT End of Session - 09/29/23 1302     Visit Number 2    Number of Visits 5    Date for OT Re-Evaluation 10/08/23    Authorization Type [employer]    OT Start Time 1302    OT Stop Time 1326    OT Time Calculation (min) 24 min    Activity Tolerance Patient tolerated treatment well;No increased pain;Patient limited by pain    Behavior During Therapy [facility] for tasks assessed/performed              Past Medical History:  Diagnosis Date   Acute deep vein thrombosis of lower limb (HCC) 2005   Initial episode in 2005 with DVT and PE following low back surgery.  [Reportedly negative evaluation from second episode right leg DVT 2007 (1/2 to 1-year); third episode in 2016 now on lifetime anticoagulation.  (Does home INR checks warfarin).   Arthritis    mild hands and knees   Cancer (HCC)    basal and squamous cell   Chronic lower back pain    Titanium clamp L4-L5   Frequent PVCs    With occasional cardiac, controlled on Toprol  25 mg   GERD (gastroesophageal reflux disease)    History of anticoagulant therapy    Hypercholesterolemia    Not currently on treatment   Hypertension    Mitral valve prolapse    By historical data.  Not confirmed in 2017.   Pneumonia 1979   Pulmonary embolism (HCC)    History of multiple DVTs with PE, on chronic anticoagulation (currently on warfarin with potential conversion to Xarelto )   Tinnitus    Past Surgical History:  Procedure Laterality Date   COLONOSCOPY  2007   x2 Nov 2022   LAPAROSCOPIC APPENDECTOMY N/A 01/29/2022   Procedure: LAPAROSCOPIC APPENDECTOMY;  Surgeon: Dareen Ebbing, MD;  Location: [facility];  Service: General;  Laterality: N/A;   LUMBAR SPINE SURGERY  2006   L4-L5 titanium clap    TONSILLECTOMY     TRANSTHORACIC ECHOCARDIOGRAM  06/2015   LVEF 60 to 65%.  No RWMA.  GR 1 DD.  Mild MR (no mention of MVP).  Normal left atrial size.  Otherwise normal valves chamber sizes.   TRANSTHORACIC ECHOCARDIOGRAM  07/2020   EF 60 to 65%.  No or WMA.  Normal RV.  Mild to moderate MR with mild late systolic prolapse of the middle scallop of the posterior leaflet.  Aortic valve sclerosis but no stenosis.  Normal RAP.   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 Pt locates pain to the MCP of the L thumb and into radial aspect of the wrist. He only rates the pain as a 2/10 " He states having progressively worse pain in the left nondominant thumb and wrist over the past year.  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 He also states Lt RF triggers and locks at times, though it is not painful today.  PRECAUTIONS: None  RED FLAGS: None   WEIGHT BEARING RESTRICTIONS: No    SUBJECTIVE:   SUBJECTIVE STATEMENT: He states he was not able to get back into therapy for about 3 weeks because the therapist's schedule was too full.   Fortunately he states he was doing his home exercise program consistently, and he has no pain and no problems now.Albert [name]     PAIN:  Are you having pain? NO!   0/10 at rest, higher with activities, gripping up to 0/10    PATIENT GOALS: To improve pain and symptoms in left thumb and hand  NEXT MD VISIT: As needed   OBJECTIVE: (All objective assessments below are from initial evaluation on: 09/08/23 unless otherwise specified.)   HAND DOMINANCE: Right   ADLs: Overall ADLs: States decreased ability to grab, hold household objects, pain and difficulty to open containers, perform FMS tasks (manipulate fasteners on clothing)   FUNCTIONAL OUTCOME MEASURES: 09/29/23: Quick DASH 0% now.    Eval: Quick DASH 20.4% impairment today  (Higher % Score  =  More Impairment)     UPPER EXTREMITY ROM     Shoulder to Wrist AROM Right eval Left eval Lt 09/29/23  Wrist flexion 55 43 60  Wrist extension 73 56 82  Wrist ulnar deviation     Wrist radial deviation     Functional dart thrower's motion (F-DTM) in ulnar flexion     F-DTM in radial extension      (Blank rows = not tested)   Hand AROM Right eval Left eval Lt 09/29/23  Full Fist Ability (or Gap to Distal Palmar Crease)  Full fist, somewhat loose   Thumb Opposition  (Kapandji Scale)   9/10   Thumb MCP (0-60) 0-51 0- 37 0 - 50  Thumb IP (0-80) 0-70 0 -65 0 - 65  Thumb Radial Abduction Span  55   (MCP J hyperextends)   68  Thumb Palmar Abduction Span      Index MCP (0-90)      Index PIP (0-100)      Index DIP (0-70)       Long MCP (0-90)       Long PIP (0-100)       Long DIP (0-70)       Ring MCP (0-90)   0-  84 0 - 80  Ring PIP (0-100)   0-  86 0 - 83  Ring DIP (0-70)   0-  36 0 - 51  Little MCP (0-90)       Little PIP (0-100)       Little DIP (0-70)       (Blank rows = not tested)   HAND FUNCTION: 09/29/23: Grip strength Left: 63 lbs   Eval: Observed weakness in affected Lt hand.  More pain in ring finger than thumb  today Grip strength Right: 96 lbs, Left: 64 lbs   COORDINATION: Eval: Only mild observed coordination impairments with affected left hand, from lack  of opposition and inhibiting pain.  Details will be tested if needed.  OBSERVATIONS:   Eval: Presents like overt left CMC joint arthritis likely in the STT joint as well due to complaints of wrist pain.  Also having triggering of the left ring finger at the MCP joint/A1.   TODAY'S TREATMENT:  09/29/23: He performs active range of motion for exercise as well as new measures which shows significant improvements at the wrist and thumb now.  We reviewed his home exercises in detail, he has no pain with these things and states understanding how to continue doing them preventatively.  We discussed home and functional activities, prevention of repetition, using tools as needed, using supportive bracing as needed, and he has no issues with these things now.  Ring finger has not been triggering, and he has not needed to wear a Band-Aid.  We discussed him discharging today to self-care, and he states this is fine as he has no complaints and he is very happy now.     Exercises reviewed today: - Seated Wrist Flexion Stretch  - 3 x daily - 3 reps - 15 hold - Wrist Prayer Stretch  - 3 x daily - 3 reps - 15 sec hold - HOOK Stretch  - 2-3 x daily - 3-5 reps - 15-20 sec hold - Stretch Thumb DOWNWARD  - 3 x daily - 3 reps - 15 sec hold - Thumb Webspace Stretch  - 3 x daily - 3 reps - 15 sec hold - Towel Roll Grip with Forearm in Neutral  - 3 x daily - 5 reps - 10 sec hold  PATIENT EDUCATION: Education details: See tx section above for details  Person educated: Patient Education method: Verbal Instruction, Teach back, Handouts  Education comprehension: States and demonstrates understanding   HOME EXERCISE PROGRAM: Access Code: [email or web address] URL: [email or web address] Date: 09/08/2023 Prepared by: Leartis Proud   GOALS: Goals reviewed with  patient? Yes   SHORT TERM GOALS: (STG required if POC>30 days) Target Date: 09/17/2023  Pt will obtain protective, custom orthotic. Goal status: N/A , D/C   2.  Pt will demo/state understanding of initial HEP to improve pain levels and prerequisite motion. Goal status: 09/29/23: MET    LONG TERM GOALS: Target Date: 10/08/23  Pt will improve functional ability by decreased impairment per Quick DASH assessment from 20.4% to 10% or better, for better quality of life. Goal status: 09/29/2023: Met  2.  Pt will improve grip strength in left hand from 64 lbs to at least 70 lbs for functional use at home and in IADLs. Goal status: 09/29/2023: not met, but now has no pain with gripping. Fine to D/C today   3.  Pt will improve A/ROM in left wrist flexion/extension from 43/56 respectively to at least 60 degrees each, to have functional motion for tasks like reach and grasp.  Goal status: 09/29/2023: Met  4.  Pt will decrease pain at worst from 4/10 to 2/10 or better to have better sleep and occupational participation in daily roles. Goal status: 09/29/2023: Met   ASSESSMENT:  CLINICAL IMPRESSION: 09/29/23: Although he was not able to get back into therapy right away, he was very consistent with his home exercise routine and he met all of his goals.  He will discharge today successfully, without complaints or pain    PLAN:  OT FREQUENCY: Discharge  OT DURATION: Discharge  PLANNED INTERVENTIONS: 97168 OT Re-evaluation, 97535 self care/ADL training, 54098 therapeutic exercise, 97530 therapeutic activity, 97112 neuromuscular re-education,  97140 manual therapy, 97035 ultrasound, 54098 fluidotherapy, 97010 moist heat, 97010 cryotherapy, 97760 Orthotics management and training, [phone] Subsequent splinting/medication, compression bandaging, Dry needling, energy conservation, coping strategies training, patient/family education, and DME and/or AE instructions  RECOMMENDED OTHER SERVICES: None  now  CONSULTED AND AGREED WITH PLAN OF CARE: Patient  PLAN FOR NEXT SESSION:   Discharge   Leartis Proud, OTR/L, CHT 09/29/2023, 1:32 PM

## 2023-10-04 ENCOUNTER — Encounter: Admitting: Physical Therapy

## 2023-10-06 ENCOUNTER — Encounter: Admitting: Rehabilitative and Restorative Service Providers"

## 2023-10-12 ENCOUNTER — Encounter: Admitting: Physical Therapy

## 2023-10-13 ENCOUNTER — Encounter: Admitting: Rehabilitative and Restorative Service Providers"

## 2023-10-19 DIAGNOSIS — L111 Transient acantholytic dermatosis [Grover]: Secondary | ICD-10-CM | POA: Diagnosis not present

## 2023-10-19 DIAGNOSIS — D225 Melanocytic nevi of trunk: Secondary | ICD-10-CM | POA: Diagnosis not present

## 2023-10-19 DIAGNOSIS — L821 Other seborrheic keratosis: Secondary | ICD-10-CM | POA: Diagnosis not present

## 2023-10-19 DIAGNOSIS — L570 Actinic keratosis: Secondary | ICD-10-CM | POA: Diagnosis not present

## 2023-10-19 DIAGNOSIS — L578 Other skin changes due to chronic exposure to nonionizing radiation: Secondary | ICD-10-CM | POA: Diagnosis not present

## 2023-10-20 ENCOUNTER — Encounter: Admitting: Rehabilitative and Restorative Service Providers"

## 2023-10-24 ENCOUNTER — Other Ambulatory Visit: Payer: Self-pay | Admitting: Family Medicine

## 2023-10-25 NOTE — Telephone Encounter (Signed)
 Last OV 08/26/23 Next OV 11/17/23  Last refill 12/01/22 Qty #4/11

## 2023-10-27 ENCOUNTER — Encounter: Admitting: Rehabilitative and Restorative Service Providers"

## 2023-11-03 ENCOUNTER — Encounter: Admitting: Rehabilitative and Restorative Service Providers"

## 2023-11-17 ENCOUNTER — Ambulatory Visit: Payer: PPO | Admitting: Family Medicine

## 2023-11-17 VITALS — BP 128/84 | HR 70 | Ht 70.0 in | Wt 201.0 lb

## 2023-11-17 DIAGNOSIS — M816 Localized osteoporosis [Lequesne]: Secondary | ICD-10-CM

## 2023-11-17 DIAGNOSIS — S22050S Wedge compression fracture of T5-T6 vertebra, sequela: Secondary | ICD-10-CM

## 2023-11-17 NOTE — Patient Instructions (Addendum)
 Thank you for coming in today.   Plan for a repeat DEXA scan

## 2023-11-17 NOTE — Progress Notes (Signed)
   Joanna Muck, PhD, LAT, ATC acting as a scribe for Garlan Juniper, MD.  Albert Bergeron. [name] is a 73 y.o. male who presents to [facility] today for annual f/u for osteoporosis. Pt started on Fosamax  on 12/01/22.  Today, pt reports no issues or side effects w/ the Fosamax . No missed doses. No falls or injury. He reports he is curious to see if his t-score changed after a year of treatment.   Dx testing: 12/01/22 Labs 11/20/22 DEXA scan   Pertinent review of systems: No fevers or chills  Relevant historical information: Compression fracture and osteoporosis.   Exam:  BP 128/84   Pulse 70   Ht 5' 10 (1.778 m)   Wt 201 lb (91.2 kg)   SpO2 97%   BMI 28.84 kg/m  General: Well Developed, well nourished, and in no acute distress.   MSK: Normal lumbar motion.      Assessment and Plan: 73 y.o. male with osteoporosis with a history of a thoracic compression fracture.  He is doing great on Fosamax  and tolerating it weekly.  He will be due for a new DEXA scan in about 1 year.  He did have a metabolic panel with PCP in February which showed normal kidney function.  Vitamin D  was last checked about a year ago and was in the normal range at 37.  He was not taking vitamin D  then and is just starting it now.  Will anticipate checking labs including vitamin D  in about a year as well.  I will set a reminder to get a DEXA scan in early June 2026.   PDMP not reviewed this encounter. No orders of the defined types were placed in this encounter.  No orders of the defined types were placed in this encounter.    Discussed warning signs or symptoms. Please see discharge instructions. Patient expresses understanding.   The above documentation has been reviewed and is accurate and complete Garlan Juniper, M.D.

## 2023-12-07 DIAGNOSIS — Z0184 Encounter for antibody response examination: Secondary | ICD-10-CM | POA: Diagnosis not present

## 2023-12-09 ENCOUNTER — Telehealth: Payer: Self-pay

## 2023-12-09 DIAGNOSIS — M816 Localized osteoporosis [Lequesne]: Secondary | ICD-10-CM

## 2023-12-09 NOTE — Telephone Encounter (Signed)
Repeat DEXA scan ordered

## 2023-12-16 ENCOUNTER — Encounter: Payer: Self-pay | Admitting: Hematology and Oncology

## 2023-12-17 ENCOUNTER — Other Ambulatory Visit: Payer: Self-pay | Admitting: Pharmacist

## 2024-01-24 DIAGNOSIS — G4700 Insomnia, unspecified: Secondary | ICD-10-CM | POA: Diagnosis not present

## 2024-01-24 DIAGNOSIS — E7800 Pure hypercholesterolemia, unspecified: Secondary | ICD-10-CM | POA: Diagnosis not present

## 2024-01-24 DIAGNOSIS — Z86711 Personal history of pulmonary embolism: Secondary | ICD-10-CM | POA: Diagnosis not present

## 2024-01-24 DIAGNOSIS — K219 Gastro-esophageal reflux disease without esophagitis: Secondary | ICD-10-CM | POA: Diagnosis not present

## 2024-01-24 DIAGNOSIS — G629 Polyneuropathy, unspecified: Secondary | ICD-10-CM | POA: Diagnosis not present

## 2024-01-24 DIAGNOSIS — M810 Age-related osteoporosis without current pathological fracture: Secondary | ICD-10-CM | POA: Diagnosis not present

## 2024-01-24 DIAGNOSIS — I341 Nonrheumatic mitral (valve) prolapse: Secondary | ICD-10-CM | POA: Diagnosis not present

## 2024-01-24 DIAGNOSIS — I493 Ventricular premature depolarization: Secondary | ICD-10-CM | POA: Diagnosis not present

## 2024-01-24 DIAGNOSIS — I10 Essential (primary) hypertension: Secondary | ICD-10-CM | POA: Diagnosis not present

## 2024-03-01 DIAGNOSIS — Z23 Encounter for immunization: Secondary | ICD-10-CM | POA: Diagnosis not present

## 2024-04-18 DIAGNOSIS — H9312 Tinnitus, left ear: Secondary | ICD-10-CM | POA: Diagnosis not present

## 2024-04-18 DIAGNOSIS — J309 Allergic rhinitis, unspecified: Secondary | ICD-10-CM | POA: Diagnosis not present

## 2024-04-18 DIAGNOSIS — H9192 Unspecified hearing loss, left ear: Secondary | ICD-10-CM | POA: Diagnosis not present

## 2024-04-27 DIAGNOSIS — H9312 Tinnitus, left ear: Secondary | ICD-10-CM | POA: Diagnosis not present

## 2024-04-27 DIAGNOSIS — H903 Sensorineural hearing loss, bilateral: Secondary | ICD-10-CM | POA: Diagnosis not present

## 2024-08-08 ENCOUNTER — Inpatient Hospital Stay: Admitting: Hematology and Oncology

## 2024-08-21 ENCOUNTER — Ambulatory Visit: Admitting: Nurse Practitioner
